# Patient Record
Sex: Female | Born: 1955
Health system: Southern US, Community
[De-identification: ages and names within clinical notes are randomized; demographics above are authoritative.]

## PROBLEM LIST (undated history)

## (undated) DIAGNOSIS — D219 Benign neoplasm of connective and other soft tissue, unspecified: Secondary | ICD-10-CM

## (undated) DIAGNOSIS — Z973 Presence of spectacles and contact lenses: Secondary | ICD-10-CM

## (undated) DIAGNOSIS — N950 Postmenopausal bleeding: Secondary | ICD-10-CM

## (undated) DIAGNOSIS — K219 Gastro-esophageal reflux disease without esophagitis: Secondary | ICD-10-CM

## (undated) DIAGNOSIS — N840 Polyp of corpus uteri: Secondary | ICD-10-CM

## (undated) DIAGNOSIS — M797 Fibromyalgia: Secondary | ICD-10-CM

## (undated) DIAGNOSIS — M1990 Unspecified osteoarthritis, unspecified site: Secondary | ICD-10-CM

## (undated) DIAGNOSIS — E785 Hyperlipidemia, unspecified: Secondary | ICD-10-CM

## (undated) HISTORY — PX: CARDIOVASCULAR STRESS TEST: SHX262

## (undated) HISTORY — DX: Fibromyalgia: M79.7

## (undated) HISTORY — PX: TONSILLECTOMY AND ADENOIDECTOMY: SUR1326

## (undated) HISTORY — DX: Benign neoplasm of connective and other soft tissue, unspecified: D21.9

## (undated) HISTORY — PX: TRANSTHORACIC ECHOCARDIOGRAM: SHX275

---

## 1990-10-01 HISTORY — PX: OTHER SURGICAL HISTORY: SHX169

## 1998-07-12 ENCOUNTER — Other Ambulatory Visit: Admission: RE | Admit: 1998-07-12 | Discharge: 1998-07-12 | Payer: Self-pay | Admitting: Obstetrics and Gynecology

## 1999-07-28 ENCOUNTER — Other Ambulatory Visit: Admission: RE | Admit: 1999-07-28 | Discharge: 1999-07-28 | Payer: Self-pay | Admitting: Obstetrics and Gynecology

## 2000-08-28 ENCOUNTER — Other Ambulatory Visit: Admission: RE | Admit: 2000-08-28 | Discharge: 2000-08-28 | Payer: Self-pay | Admitting: Gynecology

## 2004-01-19 ENCOUNTER — Other Ambulatory Visit: Admission: RE | Admit: 2004-01-19 | Discharge: 2004-01-19 | Payer: Self-pay | Admitting: Gynecology

## 2005-01-25 ENCOUNTER — Other Ambulatory Visit: Admission: RE | Admit: 2005-01-25 | Discharge: 2005-01-25 | Payer: Self-pay | Admitting: Gynecology

## 2006-01-28 ENCOUNTER — Other Ambulatory Visit: Admission: RE | Admit: 2006-01-28 | Discharge: 2006-01-28 | Payer: Self-pay | Admitting: Gynecology

## 2007-01-30 ENCOUNTER — Other Ambulatory Visit: Admission: RE | Admit: 2007-01-30 | Discharge: 2007-01-30 | Payer: Self-pay | Admitting: Gynecology

## 2008-02-10 ENCOUNTER — Other Ambulatory Visit: Admission: RE | Admit: 2008-02-10 | Discharge: 2008-02-10 | Payer: Self-pay | Admitting: Gynecology

## 2008-07-24 ENCOUNTER — Encounter: Admission: RE | Admit: 2008-07-24 | Discharge: 2008-07-24 | Payer: Self-pay | Admitting: Rheumatology

## 2008-11-11 ENCOUNTER — Ambulatory Visit: Payer: Self-pay | Admitting: Gynecology

## 2008-11-18 ENCOUNTER — Ambulatory Visit: Payer: Self-pay | Admitting: Gynecology

## 2009-02-10 ENCOUNTER — Other Ambulatory Visit: Admission: RE | Admit: 2009-02-10 | Discharge: 2009-02-10 | Payer: Self-pay | Admitting: Gynecology

## 2009-02-10 ENCOUNTER — Encounter: Payer: Self-pay | Admitting: Gynecology

## 2009-02-10 ENCOUNTER — Ambulatory Visit: Payer: Self-pay | Admitting: Gynecology

## 2009-11-10 ENCOUNTER — Ambulatory Visit: Payer: Self-pay | Admitting: Gynecology

## 2010-02-20 ENCOUNTER — Other Ambulatory Visit: Admission: RE | Admit: 2010-02-20 | Discharge: 2010-02-20 | Payer: Self-pay | Admitting: Gynecology

## 2010-02-20 ENCOUNTER — Ambulatory Visit: Payer: Self-pay | Admitting: Gynecology

## 2010-03-28 ENCOUNTER — Ambulatory Visit: Payer: Self-pay | Admitting: Gynecology

## 2010-05-23 ENCOUNTER — Ambulatory Visit: Payer: Self-pay | Admitting: Gynecology

## 2010-09-19 ENCOUNTER — Ambulatory Visit: Payer: Self-pay | Admitting: Gynecology

## 2011-02-27 ENCOUNTER — Encounter: Payer: Self-pay | Admitting: Gynecology

## 2011-03-05 ENCOUNTER — Other Ambulatory Visit (HOSPITAL_COMMUNITY)
Admission: RE | Admit: 2011-03-05 | Discharge: 2011-03-05 | Disposition: A | Discharge status: Home / Self Care | Payer: BC Managed Care – PPO | Source: Ambulatory Visit | Attending: Gynecology | Admitting: Gynecology

## 2011-03-06 ENCOUNTER — Encounter (INDEPENDENT_AMBULATORY_CARE_PROVIDER_SITE_OTHER): Payer: BC Managed Care – PPO | Admitting: Gynecology

## 2011-03-06 ENCOUNTER — Other Ambulatory Visit: Payer: Self-pay | Admitting: Gynecology

## 2011-03-06 DIAGNOSIS — Z13220 Encounter for screening for lipoid disorders: Secondary | ICD-10-CM

## 2011-03-06 DIAGNOSIS — Z833 Family history of diabetes mellitus: Secondary | ICD-10-CM

## 2011-03-06 DIAGNOSIS — Z01419 Encounter for gynecological examination (general) (routine) without abnormal findings: Secondary | ICD-10-CM

## 2011-03-06 DIAGNOSIS — Z124 Encounter for screening for malignant neoplasm of cervix: Secondary | ICD-10-CM | POA: Insufficient documentation

## 2011-03-14 ENCOUNTER — Other Ambulatory Visit: Payer: Self-pay | Admitting: Gynecology

## 2011-03-14 DIAGNOSIS — Z1231 Encounter for screening mammogram for malignant neoplasm of breast: Secondary | ICD-10-CM

## 2011-03-20 ENCOUNTER — Ambulatory Visit
Admission: RE | Admit: 2011-03-20 | Discharge: 2011-03-20 | Disposition: A | Discharge status: Home / Self Care | Payer: BC Managed Care – PPO | Source: Ambulatory Visit | Attending: Gynecology | Admitting: Gynecology

## 2011-03-20 DIAGNOSIS — Z1231 Encounter for screening mammogram for malignant neoplasm of breast: Secondary | ICD-10-CM

## 2011-03-27 ENCOUNTER — Other Ambulatory Visit (HOSPITAL_COMMUNITY): Payer: Self-pay | Admitting: Rheumatology

## 2011-03-27 ENCOUNTER — Ambulatory Visit (HOSPITAL_COMMUNITY)
Admission: RE | Admit: 2011-03-27 | Discharge: 2011-03-27 | Disposition: A | Discharge status: Home / Self Care | Payer: BC Managed Care – PPO | Source: Ambulatory Visit | Attending: Rheumatology | Admitting: Rheumatology

## 2011-03-27 DIAGNOSIS — R0602 Shortness of breath: Secondary | ICD-10-CM

## 2011-03-27 DIAGNOSIS — R062 Wheezing: Secondary | ICD-10-CM | POA: Insufficient documentation

## 2011-03-27 DIAGNOSIS — M4120 Other idiopathic scoliosis, site unspecified: Secondary | ICD-10-CM | POA: Insufficient documentation

## 2011-06-06 ENCOUNTER — Ambulatory Visit (INDEPENDENT_AMBULATORY_CARE_PROVIDER_SITE_OTHER): Payer: BC Managed Care – PPO | Admitting: Gynecology

## 2011-06-06 ENCOUNTER — Encounter: Payer: Self-pay | Admitting: Gynecology

## 2011-06-06 ENCOUNTER — Telehealth: Payer: Self-pay | Admitting: *Deleted

## 2011-06-06 DIAGNOSIS — N926 Irregular menstruation, unspecified: Secondary | ICD-10-CM

## 2011-06-06 DIAGNOSIS — Z79890 Hormone replacement therapy (postmenopausal): Secondary | ICD-10-CM

## 2011-06-06 NOTE — Progress Notes (Signed)
Patient presents complaining of irregular bleeding. She is on HRT estradiol 1 mg daily Prometrium 200 mg for 12 days each month. She has been having light withdrawal bleeds but just started to sporadically bleed now over the last 2 weeks. She did have an endometrial biopsy December 2011 which showed fragments of proliferative endometrium with breakdown.  Exam Abdomen soft nontender without masses guarding rebound organomegaly Pelvic external BUS vagina with light menses flow cervix normal bimanual uterus normal size midline mobile nontender adnexa without masses or tenderness  Assessment and plan: Perimenopausal irregular bleeding on HRT. Did have an endometrial sample 9 months ago. Have recommended we go ahead with sonohysterogram now just to rule out endometrial abnormalities well as to resample her.  She does relate that her daughter has what sounds to be Hashimoto's thyroiditis I did order a TSH T4-T3 uptake. I discussed for his options to include the manipulation of her HRT regimen up to and including low-dose oral contraceptives. She asked about hysterectomy I reviewed with her I think this will drastic this point that we should at least try more conservative attempts to control her bleeding before proceeding with that.

## 2011-06-06 NOTE — Telephone Encounter (Signed)
PT CALLING C/O VAGINAL BLEEDING. LMP:05/24/11 SHE STATES ON 05/19/11 HAD BROWNISH DISCHARGE WITH BLOOD FOR ABOUT 6 DAYS. THEN HER PERIOD CAME ON. PT IS CONCERNED WITH ALL THE BLEEDING. SHE HAS TAKEN ALL THE RX AS DIRECTED ESTRADIOL 1MG  AND PROMETRIUM 200MG . PLEASE ADVISE.

## 2011-06-06 NOTE — Telephone Encounter (Signed)
PT INFORMED WITH THE BELOW NOTE, PT TRANSFERRED TO APPOINTMENT DESK.

## 2011-06-06 NOTE — Telephone Encounter (Signed)
Recommend office visit for evaluation

## 2011-06-07 LAB — T3 UPTAKE: T3 Uptake: 27.9 % (ref 22.5–37.0)

## 2011-06-21 ENCOUNTER — Ambulatory Visit (INDEPENDENT_AMBULATORY_CARE_PROVIDER_SITE_OTHER): Admission: RE | Admit: 2011-06-21 | Payer: BC Managed Care – PPO | Source: Ambulatory Visit

## 2011-06-21 ENCOUNTER — Other Ambulatory Visit: Payer: Self-pay | Admitting: Gynecology

## 2011-06-21 ENCOUNTER — Ambulatory Visit (INDEPENDENT_AMBULATORY_CARE_PROVIDER_SITE_OTHER): Payer: BC Managed Care – PPO | Admitting: Gynecology

## 2011-06-21 DIAGNOSIS — N938 Other specified abnormal uterine and vaginal bleeding: Secondary | ICD-10-CM

## 2011-06-21 DIAGNOSIS — D251 Intramural leiomyoma of uterus: Secondary | ICD-10-CM

## 2011-06-21 DIAGNOSIS — N926 Irregular menstruation, unspecified: Secondary | ICD-10-CM

## 2011-06-21 DIAGNOSIS — D259 Leiomyoma of uterus, unspecified: Secondary | ICD-10-CM

## 2011-06-21 DIAGNOSIS — N950 Postmenopausal bleeding: Secondary | ICD-10-CM

## 2011-06-21 DIAGNOSIS — N949 Unspecified condition associated with female genital organs and menstrual cycle: Secondary | ICD-10-CM

## 2011-06-21 DIAGNOSIS — N920 Excessive and frequent menstruation with regular cycle: Secondary | ICD-10-CM

## 2011-06-21 DIAGNOSIS — N831 Corpus luteum cyst of ovary, unspecified side: Secondary | ICD-10-CM

## 2011-06-21 DIAGNOSIS — N8300 Follicular cyst of ovary, unspecified side: Secondary | ICD-10-CM

## 2011-06-21 NOTE — Progress Notes (Signed)
Patient presents with sonohysterogram due to postmenopausal bleeding on HRT. Ultrasound initially showed several small myomas the largest measuring 31 mm followed by 29 and 25. Several physiologic small changes in right / left ovaries which appear normal. Endometrial echo 7.3 mm. Sonohysterogram was performed, sterile technique, easy catheter introduction, good distention with no abnormalities. An endometrial biopsy was taken. Patient tolerated procedure well.  Postmenopausal bleeding, negative sonohysterogram other than several small myomas. She will call for biopsy results, assuming benign will keep menstrual calendar for now since she has regular withdrawal bleeds after her monthly Prometrium will follow if irregular bleeding continues we'll discuss alternative hormone regimens. Patient is asking about hysterectomy and I again discouraged her at this point as I think we have more conservative alternatives.

## 2012-03-04 ENCOUNTER — Other Ambulatory Visit: Payer: Self-pay | Admitting: *Deleted

## 2012-03-04 MED ORDER — ALPRAZOLAM 0.25 MG PO TABS: 0.2500 mg | ORAL_TABLET | Freq: Every evening | ORAL | Status: DC | PRN

## 2012-03-04 NOTE — Telephone Encounter (Signed)
rx called in

## 2012-03-20 ENCOUNTER — Other Ambulatory Visit: Payer: Self-pay | Admitting: Gynecology

## 2012-03-20 MED ORDER — ESTRADIOL 1 MG PO TABS: 1.0000 mg | ORAL_TABLET | Freq: Every day | ORAL | Status: DC

## 2012-03-20 MED ORDER — PROGESTERONE MICRONIZED 200 MG PO CAPS: 200.0000 mg | ORAL_CAPSULE | Freq: Every day | ORAL | Status: DC

## 2012-05-05 ENCOUNTER — Other Ambulatory Visit: Payer: Self-pay | Admitting: *Deleted

## 2012-05-05 MED ORDER — PROGESTERONE MICRONIZED 200 MG PO CAPS: 200.0000 mg | ORAL_CAPSULE | Freq: Every day | ORAL | Status: DC

## 2012-05-05 MED ORDER — ESTRADIOL 1 MG PO TABS: 1.0000 mg | ORAL_TABLET | Freq: Every day | ORAL | Status: DC

## 2012-05-15 ENCOUNTER — Ambulatory Visit (INDEPENDENT_AMBULATORY_CARE_PROVIDER_SITE_OTHER): Payer: BC Managed Care – PPO | Admitting: Gynecology

## 2012-05-15 ENCOUNTER — Encounter: Payer: Self-pay | Admitting: Gynecology

## 2012-05-15 VITALS — BP 130/84 | Ht 64.5 in | Wt 159.5 lb

## 2012-05-15 DIAGNOSIS — Z01419 Encounter for gynecological examination (general) (routine) without abnormal findings: Secondary | ICD-10-CM

## 2012-05-15 DIAGNOSIS — Z79890 Hormone replacement therapy (postmenopausal): Secondary | ICD-10-CM

## 2012-05-15 MED ORDER — PROGESTERONE MICRONIZED 100 MG PO CAPS: 100.0000 mg | ORAL_CAPSULE | Freq: Every day | ORAL | Status: DC

## 2012-05-15 MED ORDER — ESTRADIOL 1 MG PO TABS: 1.0000 mg | ORAL_TABLET | Freq: Every day | ORAL | Status: DC

## 2012-05-15 NOTE — Patient Instructions (Signed)
Switch hormone replacement to continuous with estradiol and progesterone daily. Call if any bleeding or any other issues. Schedule your mammogram now. Follow up in one year for annual gynecologic exam.

## 2012-05-15 NOTE — Progress Notes (Signed)
Katie Shepard Feb 24, 1956 [id number]        56 y.o.  [id number] for annual exam.  Several issues noted below.  Past medical history,surgical history, medications, allergies, family history and social history were all reviewed and documented in the EPIC chart. ROS:  Was performed and pertinent positives and negatives are included in the history.  Exam: Sherrilyn Rist assistant Filed Vitals:   05/15/12 1053  BP: 130/84  Height: 5' 4.5" (1.638 m)  Weight: 159 lb 8 oz (72.349 kg)   General appearance  Normal Skin grossly normal Head/Neck normal with no cervical or supraclavicular adenopathy thyroid normal Lungs  clear Cardiac RR, without RMG Abdominal  soft, nontender, without masses, organomegaly or hernia Breasts  examined lying and sitting without masses, retractions, discharge or axillary adenopathy. Pelvic  Ext/BUS/vagina  normal   Cervix  normal   Uterus  anteverted, normal size, shape and contour, midline and mobile nontender   Adnexa  Without masses or tenderness    Anus and perineum  normal   Rectovaginal  normal sphincter tone without palpated masses or tenderness.    Assessment/Plan:  56 y.o. [id number] female for annual exam.   1. Postmenopausal/HRT. Patient is on estradiol 1 mg daily and Prometrium 200 mg for the first 12 days each month. She has a very light withdrawal bleed but sometimes skips a month in bleeding.  Having good response as far as hot flashes and sweats. Reviewed again ACOG and NAMS statements for lowest dose for shortest period of time in the [facility] study with increased risks of stroke heart attack DVT and breast cancer. Options to wean now versus continuing reviewed. Patient wants to continue. Options to go on daily dose of estradiol 1 mg/Prometrium 100 mg nightly versus continuing with the intermittent withdrawal discussed and she wants to try the daily dose to avoid staining. Estradiol 1 mg and Prometrium 100 mg daily prescribed x1 year. Follow up if any  issues. 2. Mammography. Patient had her mammogram last year I recommended repeat this year. SBE monthly reviewed. 3. Pap smear. Pap 2012 normal. No Pap done today. Review current screening guidelines we'll plan every 3-5 your screening. 4. DEXA. DEXA June 2011 normal. Plan repeat at five-year interval. Increase calcium vitamin D reviewed. 5. Colonoscopy. Patient had colonoscopy this past year with polyp found. Recommend repeat in 3 years. 6. Health maintenance. Recently started on antihypertensive. Having some swelling in her feet and I recommended she follow up with her primary in reference to this. No blood work was done today as it is all done through her primary physician's office. Follow up in one year, sooner as needed.    Dara Lords MD, 11:29 AM 05/15/2012

## 2012-05-16 LAB — URINALYSIS W MICROSCOPIC + REFLEX CULTURE
Glucose, UA: NEGATIVE mg/dL
Hgb urine dipstick: NEGATIVE
Ketones, ur: NEGATIVE mg/dL
Leukocytes, UA: NEGATIVE
Nitrite: NEGATIVE
Specific Gravity, Urine: 1.01 (ref 1.005–1.030)
Urobilinogen, UA: 0.2 mg/dL (ref 0.0–1.0)

## 2013-01-23 ENCOUNTER — Other Ambulatory Visit: Payer: Self-pay

## 2013-01-25 MED ORDER — ALPRAZOLAM 0.25 MG PO TABS: 0.2500 mg | ORAL_TABLET | Freq: Every evening | ORAL | Status: DC | PRN

## 2013-01-26 NOTE — Telephone Encounter (Signed)
Called into pharmacy

## 2013-06-09 ENCOUNTER — Other Ambulatory Visit: Payer: Self-pay | Admitting: *Deleted

## 2013-06-09 MED ORDER — ESTRADIOL 1 MG PO TABS: 1.0000 mg | ORAL_TABLET | Freq: Every day | ORAL | Status: DC

## 2013-06-09 MED ORDER — PROGESTERONE MICRONIZED 100 MG PO CAPS: 100.0000 mg | ORAL_CAPSULE | Freq: Every day | ORAL | Status: DC

## 2013-07-16 ENCOUNTER — Encounter: Payer: Self-pay | Admitting: Gynecology

## 2013-07-16 ENCOUNTER — Ambulatory Visit (INDEPENDENT_AMBULATORY_CARE_PROVIDER_SITE_OTHER): Payer: BC Managed Care – PPO | Admitting: Gynecology

## 2013-07-16 VITALS — BP 122/84 | Ht 65.0 in | Wt 169.0 lb

## 2013-07-16 DIAGNOSIS — N950 Postmenopausal bleeding: Secondary | ICD-10-CM

## 2013-07-16 DIAGNOSIS — Z79890 Hormone replacement therapy (postmenopausal): Secondary | ICD-10-CM

## 2013-07-16 DIAGNOSIS — Z01419 Encounter for gynecological examination (general) (routine) without abnormal findings: Secondary | ICD-10-CM

## 2013-07-16 NOTE — Patient Instructions (Addendum)
Follow up for ultrasound as scheduled.  Call to Schedule your mammogram  Facilities in [place]: 1)  [facility], [address] Phone: [phone] 2)  [facility], [address] Phone: [phone] 3)  Dr. Bertrand at [facility]  [address] Phone: [phone]     Mammogram A mammogram is an X-ray test to find changes in a woman's breast. You should get a mammogram if:  You are 40 years of age or older  You have risk factors.   Your doctor recommends that you have one.  BEFORE THE TEST  Do not schedule the test the week before your period, especially if your breasts are sore during this time.  On the day of your mammogram:  Wash your breasts and armpits well. After washing, do not put on any deodorant or talcum powder on until after your test.   Eat and drink as you usually do.   Take your medicines as usual.   If you are diabetic and take insulin, make sure you:   Eat before coming for your test.   Take your insulin as usual.   If you cannot keep your appointment, call before the appointment to cancel. Schedule another appointment.  TEST  You will need to undress from the waist up. You will put on a hospital gown.   Your breast will be put on the mammogram machine, and it will press firmly on your breast with a piece of plastic called a compression paddle. This will make your breast flatter so that the machine can X-ray all parts of your breast.   Both breasts will be X-rayed. Each breast will be X-rayed from above and from the side. An X-ray might need to be taken again if the picture is not good enough.   The mammogram will last about 15 to 30 minutes.  AFTER THE TEST Finding out the results of your test Ask when your test results will be ready. Make sure you get your test results.  Document Released: 12/14/2008 Document Revised: 09/06/2011 Document  Reviewed: 12/14/2008 [employer] Patient Information 2012 [employer].   

## 2013-07-16 NOTE — Progress Notes (Signed)
BREANA LITTS 1956/02/17 [id number]        57 y.o.  [id number] for annual exam.  Several issues that are below.  Past medical history,surgical history, medications, allergies, family history and social history were all reviewed and documented in the EPIC chart.  ROS:  Performed and pertinent positives and negatives are included in the history, assessment and plan .  Exam: Kim assistant Filed Vitals:   07/16/13 1215  BP: 122/84  Height: 5\' 5"  (1.651 m)  Weight: 169 lb (76.658 kg)   General appearance  Normal Skin grossly normal Head/Neck normal with no cervical or supraclavicular adenopathy thyroid normal Lungs  clear Cardiac RR, without RMG Abdominal  soft, nontender, without masses, organomegaly or hernia Breasts  examined lying and sitting without masses, retractions, discharge or axillary adenopathy. Pelvic  Ext/BUS/vagina  normal  Cervix  normal with mucoid staining at the cervical os  Uterus  anteverted, normal size, shape and contour, midline and mobile nontender   Adnexa  Without masses or tenderness    Anus and perineum  normal   Rectovaginal  normal sphincter tone without palpated masses or tenderness.    Assessment/Plan:  57 y.o. [id number] female for annual exam.   1. Postmenopausal/HRT/breakthrough bleeding. Patient on estradiol 1 mg and Prometrium 100 mg nightly. Has had some staining on and off for the past several months. History of sonohysterogram with endometrial biopsy 2 years ago. Several small myomas noted otherwise negative. We'll go ahead and relooked with sonohysterogram now and then discuss changing her hormone regimen as she does when continue on this.  We've discussed the risks of HRT to include the [facility] study with risks of stroke heart attack DVT and breast cancer as in the past 05/15/2012 at. We'll further discuss regiment after sonohysterogram. 2. Mammography 2012. Patient knows she is overdue and agrees to schedule. SBE monthly reviewed. 3. Pap smear 2012.  Pap smear done today. No history of significant abnormal Pap smears. Plan repeat Pap smear next year 3 year interval. 4. DEXA 2011 normal. Plan repeat closer to 62. Increase calcium vitamin D reviewed. 5. Colonoscopy 2012. Repeated their recommended interval. 6. Health maintenance. Sees Dr. Evlyn Kanner who does her routine blood work. Followup for sonohysterogram  Note: This document was prepared with digital dictation and possible smart phrase technology. Any transcriptional errors that result from this process are unintentional.   Dara Lords MD, 12:40 PM 07/16/2013

## 2013-08-07 ENCOUNTER — Ambulatory Visit (INDEPENDENT_AMBULATORY_CARE_PROVIDER_SITE_OTHER): Payer: BC Managed Care – PPO | Admitting: Gynecology

## 2013-08-07 ENCOUNTER — Ambulatory Visit (INDEPENDENT_AMBULATORY_CARE_PROVIDER_SITE_OTHER): Payer: BC Managed Care – PPO

## 2013-08-07 ENCOUNTER — Other Ambulatory Visit: Payer: Self-pay | Admitting: Gynecology

## 2013-08-07 ENCOUNTER — Encounter: Payer: Self-pay | Admitting: Gynecology

## 2013-08-07 DIAGNOSIS — D251 Intramural leiomyoma of uterus: Secondary | ICD-10-CM

## 2013-08-07 DIAGNOSIS — N950 Postmenopausal bleeding: Secondary | ICD-10-CM

## 2013-08-07 NOTE — Patient Instructions (Signed)
Office will call you with biopsy results.  Call if bleeding continues. 

## 2013-08-07 NOTE — Progress Notes (Signed)
Patient presents for sonohysterogram due to post menopausal bleeding on HRT. She's been spotting intermittently on and off.  Ultrasound shows uterus grossly normal/generous in size with multiple small myomas the largest measuring 32 mm. Endometrial echo 6.8 mm. Right and left ovaries are normal noting left was difficult to visualize but no pathologic seen. Cul-de-sac is negative.  Sonohysterogram performed, sterile technique, easy catheter introduction, good distention with no abnormalities. Endometrial sample taken. Patient tolerated well.  Assessment and plan: Postmenopausal spotting on HRT. Will followup for biopsy. Assuming negative will follow bleeding at present. If it continues then we'll readjust her HRT. If it resolves then we will follow.

## 2013-08-14 ENCOUNTER — Other Ambulatory Visit: Payer: Self-pay | Admitting: Gynecology

## 2013-08-14 NOTE — Telephone Encounter (Signed)
You saw patient on 08/07/13 for Katie Shepard and wrote: "Assessment and plan: Postmenopausal spotting on HRT. Will followup for biopsy. Assuming negative will follow bleeding at present. If it continues then we'll readjust her HRT. If it resolves then we will follow."  She does not currently have appt scheduled.

## 2013-11-16 ENCOUNTER — Ambulatory Visit (INDEPENDENT_AMBULATORY_CARE_PROVIDER_SITE_OTHER): Payer: BC Managed Care – PPO | Admitting: Gynecology

## 2013-11-16 ENCOUNTER — Encounter: Payer: Self-pay | Admitting: Gynecology

## 2013-11-16 DIAGNOSIS — N949 Unspecified condition associated with female genital organs and menstrual cycle: Secondary | ICD-10-CM

## 2013-11-16 DIAGNOSIS — N9089 Other specified noninflammatory disorders of vulva and perineum: Secondary | ICD-10-CM

## 2013-11-16 DIAGNOSIS — N898 Other specified noninflammatory disorders of vagina: Secondary | ICD-10-CM

## 2013-11-16 DIAGNOSIS — R32 Unspecified urinary incontinence: Secondary | ICD-10-CM

## 2013-11-16 DIAGNOSIS — R102 Pelvic and perineal pain: Secondary | ICD-10-CM

## 2013-11-16 LAB — URINALYSIS W MICROSCOPIC + REFLEX CULTURE
BILIRUBIN URINE: NEGATIVE
Glucose, UA: NEGATIVE mg/dL
Hgb urine dipstick: NEGATIVE
KETONES UR: NEGATIVE mg/dL
Leukocytes, UA: NEGATIVE
Nitrite: NEGATIVE
PROTEIN: NEGATIVE mg/dL
Specific Gravity, Urine: 1.015 (ref 1.005–1.030)
UROBILINOGEN UA: 0.2 mg/dL (ref 0.0–1.0)
pH: 5.5 (ref 5.0–8.0)

## 2013-11-16 LAB — WET PREP FOR TRICH, YEAST, CLUE
Trich, Wet Prep: NONE SEEN
Yeast Wet Prep HPF POC: NONE SEEN

## 2013-11-16 MED ORDER — METRONIDAZOLE 500 MG PO TABS: 500.0000 mg | ORAL_TABLET | Freq: Two times a day (BID) | ORAL | Status: DC

## 2013-11-16 NOTE — Patient Instructions (Signed)
Take Flagyl medication twice daily for 7 days. Avoid alcohol while taking. Call if her pelvic discomfort continues and we will schedule a GYN ultrasound. Office will contact you to arrange urology appointment.

## 2013-11-16 NOTE — Progress Notes (Signed)
Katie Shepard [birth year] [id number]        58 y.o.  [id number] patient presents with a number of complaints to include: 1. Urinary incontinence with both stress and urgency symptoms. Been going on for several years but seems to gradually be getting worse. Has tried Kegel exercises without much benefit. 2. Vulvar bump over the last several days which now is draining, using sitz baths. 3. Vaginal discharge over the last week or so with some irritation and itching. No odor. 4. Suprapubic discomfort aching of the last [redacted] weeks along with intermittent right lower quadrant discomfort. No nausea vomiting diarrhea constipation. No acute changes in her urinary symptoms. No fever chills low back pain.  Past medical history,surgical history, problem list, medications, allergies, family history and social history were all reviewed and documented in the EPIC chart.  Exam: Kim assistant General appearance  Normal  Spine straight without CVA tenderness. Abdomen soft nontender without masses guarding rebound organomegaly. Pelvic external BUS vagina small pustule lower right labia minora draining.  Slight white discharge with perineal erythema. No specific lesions. Cervix grossly normal. Uterus grossly normal size midline mobile nontender. Adnexa without masses or tenderness  Assessment/Plan:  58 y.o. [id number]  with: 1. Urinary incontinence, mixed pattern with both stress and urgency symptoms. Urinalysis is negative. Discussed possible interstitial cystitis. Options for medication trial such as OAB medication versus urology referral discussed. Patient agrees with urology referral and we'll go ahead and facilitate this for her. 2. Small vulvar pustule draining. Continue with sitz baths until results. Followup if it persists or worsens. 3. Suprapubic discomfort with some right lower quadrant pain. Exam is normal. Urinalysis is negative. Since relatively short history will observe at present. Continue she'll call me  and we'll plan GYN ultrasound rule out nonpalpable abnormalities. 4. Vaginal discharge. Wet prep consistent with early bacterial vaginosis. Will treat with Flagyl 500 mg twice a day x7 days, alcohol avoidance reviewed. Followup if persists, worsens or recurs.   Note: This document was prepared with digital dictation and possible smart phrase technology. Any transcriptional errors that result from this process are unintentional.   Anastasio Auerbach MD, 11:33 AM 11/16/2013

## 2013-11-19 ENCOUNTER — Telehealth: Payer: Self-pay | Admitting: *Deleted

## 2013-11-19 NOTE — Telephone Encounter (Signed)
Message copied by Thamas Jaegers on Thu Nov 19, 2013  9:33 AM ------      Message from: Anastasio Auerbach      Created: Mon Nov 16, 2013 11:31 AM       Schedule appointment with urology reference to urinary incontinence. ------

## 2013-11-19 NOTE — Telephone Encounter (Signed)
Appt. On 12/01/13 @ 1:00 pm with Dr.Ottelin, notes faxed. Left message for pt to call.

## 2013-11-23 ENCOUNTER — Other Ambulatory Visit: Payer: Self-pay

## 2013-11-23 MED ORDER — ALPRAZOLAM 0.25 MG PO TABS: 0.2500 mg | ORAL_TABLET | Freq: Every evening | ORAL | Status: DC | PRN

## 2013-11-23 NOTE — Telephone Encounter (Signed)
Called into pharmacy

## 2013-11-24 NOTE — Telephone Encounter (Signed)
Pt informed with the below note. 

## 2014-08-02 ENCOUNTER — Encounter: Payer: Self-pay | Admitting: Gynecology

## 2014-08-10 ENCOUNTER — Telehealth: Payer: Self-pay | Admitting: *Deleted

## 2014-08-10 MED ORDER — ALPRAZOLAM 0.25 MG PO TABS: 0.2500 mg | ORAL_TABLET | Freq: Every evening | ORAL | Status: DC | PRN

## 2014-08-10 NOTE — Telephone Encounter (Signed)
Pt requesting refill on xanax 0.25 mg tablet last filled on 11/23/13. Okay to fill?

## 2014-08-10 NOTE — Telephone Encounter (Signed)
Rx called in 

## 2014-08-10 NOTE — Telephone Encounter (Signed)
Okay for Xanax 0.25 mg #30 refill 2

## 2014-09-08 ENCOUNTER — Other Ambulatory Visit: Payer: Self-pay | Admitting: Gynecology

## 2014-10-11 ENCOUNTER — Other Ambulatory Visit: Payer: Self-pay | Admitting: Gynecology

## 2014-11-22 ENCOUNTER — Encounter: Payer: Self-pay | Admitting: Gynecology

## 2014-11-22 ENCOUNTER — Other Ambulatory Visit (HOSPITAL_COMMUNITY)
Admission: RE | Admit: 2014-11-22 | Discharge: 2014-11-22 | Disposition: A | Discharge status: Home / Self Care | Payer: BLUE CROSS/BLUE SHIELD | Source: Ambulatory Visit | Attending: Gynecology | Admitting: Gynecology

## 2014-11-22 ENCOUNTER — Ambulatory Visit (INDEPENDENT_AMBULATORY_CARE_PROVIDER_SITE_OTHER): Payer: BLUE CROSS/BLUE SHIELD | Admitting: Gynecology

## 2014-11-22 VITALS — BP 120/80 | Ht 65.0 in | Wt 185.0 lb

## 2014-11-22 DIAGNOSIS — Z79890 Hormone replacement therapy (postmenopausal): Secondary | ICD-10-CM

## 2014-11-22 DIAGNOSIS — D251 Intramural leiomyoma of uterus: Secondary | ICD-10-CM

## 2014-11-22 DIAGNOSIS — Z01419 Encounter for gynecological examination (general) (routine) without abnormal findings: Secondary | ICD-10-CM

## 2014-11-22 DIAGNOSIS — Z1151 Encounter for screening for human papillomavirus (HPV): Secondary | ICD-10-CM | POA: Insufficient documentation

## 2014-11-22 DIAGNOSIS — N950 Postmenopausal bleeding: Secondary | ICD-10-CM

## 2014-11-22 NOTE — Progress Notes (Signed)
Katie Shepard May 13, 1956 [id number]        59 y.o.  [id number] for annual exam.  Several issues noted below.  Past medical history,surgical history, problem list, medications, allergies, family history and social history were all reviewed and documented as reviewed in the EPIC chart.  ROS:  Performed with pertinent positives and negatives included in the history, assessment and plan.   Additional significant findings :  none   Exam: Kim Counsellor Vitals:   11/22/14 1151  BP: 120/80  Height: 5\' 5"  (1.651 m)  Weight: 185 lb (83.915 kg)   General appearance:  Normal affect, orientation and appearance. Skin: Grossly normal HEENT: Without gross lesions.  No cervical or supraclavicular adenopathy. Thyroid normal.  Lungs:  Clear without wheezing, rales or rhonchi Cardiac: RR, without RMG Abdominal:  Soft, nontender, without masses, guarding, rebound, organomegaly or hernia Breasts:  Examined lying and sitting without masses, retractions, discharge or axillary adenopathy. Pelvic:  Ext/BUS/vagina with mild trophic changes  Cervix grossly normal with mucoid bloody discharge. Pap/HPV  Uterus anteverted, normal size, shape and contour, midline and mobile nontender   Adnexa  Without masses or tenderness    Anus and perineum  Normal   Rectovaginal  Normal sphincter tone without palpated masses or tenderness.    Assessment/Plan:  59 y.o. [id number] female for annual exam.   1. Postmenopausal/HRT. Patient on Estrace 1 mg and Prometrium 100 mg nightly. Has almost daily mucoid bloody staining. Was evaluated with sonohysterogram 08/2013 which was negative for intracavitary abnormalities and a negative endometrial biopsy. Did show several small myomas. Recommend repeat sonohysterogram now on patient agrees to schedule. I again reviewed HRT with her and the options to wean versus continuing. Risks reviewed to include increased risk of stroke heart attack DVT and breast cancer. If patient desires to  continue then I reviewed changing her regimen to hopefully illuminate the staining assuming her sonohysterogram and biopsy are negative. We'll further discuss at her sonohysterogram.  I did not refill her HRT at this point and will readdress at the sonohysterogram 2. Mammography 2012. Strongly recommended patient schedule her screening mammogram as she agrees to do so. Benefits of early detection reviewed. SBE monthly reviewed. 3. Pap smear 2012. Pap smear/HPV done today. No history of significant abnormal Pap smears previously. 4. DEXA 2011 normal. Plan repeat DEXA at age 28. Increased calcium vitamin D reviewed. 5. Colonoscopy 2013. Repeat at their recommended interval. 6. Audible wheezing with cough. Patient notes that whenever she is exerting herself. Lungs are clear and throat/pharynx is normal. Recommend ENT for vocal cord exam and overall assessment.  Patient will call and schedule him enough she has problems arranging this. 7. Health maintenance. No routine blood work done as she reports this done at her primary physician's office. Follow up for sonohysterogram.     Anastasio Auerbach MD, 12:14 PM 11/22/2014

## 2014-11-22 NOTE — Addendum Note (Signed)
Addended by: Nelva Nay on: 11/22/2014 12:21 PM   Modules accepted: Orders

## 2014-11-22 NOTE — Patient Instructions (Signed)
Follow up with the ENT doctor as we discussed. Call if you have any issues arranging this. Schedule your mammography. Follow up for the GYN ultrasound as scheduled.  You may obtain a copy of any labs that were done today by logging onto MyChart as outlined in the instructions provided with your AVS (after visit summary). The office will not call with normal lab results but certainly if there are any significant abnormalities then we will contact you.   Health Maintenance, Female A healthy lifestyle and preventative care can promote health and wellness.  Maintain regular health, dental, and eye exams.  Eat a healthy diet. Foods like vegetables, fruits, whole grains, low-fat dairy products, and lean protein foods contain the nutrients you need without too many calories. Decrease your intake of foods high in solid fats, added sugars, and salt. Get information about a proper diet from your caregiver, if necessary.  Regular physical exercise is one of the most important things you can do for your health. Most adults should get at least 150 minutes of moderate-intensity exercise (any activity that increases your heart rate and causes you to sweat) each week. In addition, most adults need muscle-strengthening exercises on 2 or more days a week.   Maintain a healthy weight. The body mass index (BMI) is a screening tool to identify possible weight problems. It provides an estimate of body fat based on height and weight. Your caregiver can help determine your BMI, and can help you achieve or maintain a healthy weight. For adults 20 years and older:  A BMI below 18.5 is considered underweight.  A BMI of 18.5 to 24.9 is normal.  A BMI of 25 to 29.9 is considered overweight.  A BMI of 30 and above is considered obese.  Maintain normal blood lipids and cholesterol by exercising and minimizing your intake of saturated fat. Eat a balanced diet with plenty of fruits and vegetables. Blood tests for lipids  and cholesterol should begin at age 72 and be repeated every 5 years. If your lipid or cholesterol levels are high, you are over 50, or you are a high risk for heart disease, you may need your cholesterol levels checked more frequently.Ongoing high lipid and cholesterol levels should be treated with medicines if diet and exercise are not effective.  If you smoke, find out from your caregiver how to quit. If you do not use tobacco, do not start.  Lung cancer screening is recommended for adults aged 26 80 years who are at high risk for developing lung cancer because of a history of smoking. Yearly low-dose computed tomography (CT) is recommended for people who have at least a 30-pack-year history of smoking and are a current smoker or have quit within the past 15 years. A pack year of smoking is smoking an average of 1 pack of cigarettes a day for 1 year (for example: 1 pack a day for 30 years or 2 packs a day for 15 years). Yearly screening should continue until the smoker has stopped smoking for at least 15 years. Yearly screening should also be stopped for people who develop a health problem that would prevent them from having lung cancer treatment.  If you are pregnant, do not drink alcohol. If you are breastfeeding, be very cautious about drinking alcohol. If you are not pregnant and choose to drink alcohol, do not exceed 1 drink per day. One drink is considered to be 12 ounces (355 mL) of beer, 5 ounces (148 mL) of wine, or  1.5 ounces (44 mL) of liquor.  Avoid use of street drugs. Do not share needles with anyone. Ask for help if you need support or instructions about stopping the use of drugs.  High blood pressure causes heart disease and increases the risk of stroke. Blood pressure should be checked at least every 1 to 2 years. Ongoing high blood pressure should be treated with medicines, if weight loss and exercise are not effective.  If you are 85 to 57 years old, ask your caregiver if you  should take aspirin to prevent strokes.  Diabetes screening involves taking a blood sample to check your fasting blood sugar level. This should be done once every 3 years, after age 41, if you are within normal weight and without risk factors for diabetes. Testing should be considered at a younger age or be carried out more frequently if you are overweight and have at least 1 risk factor for diabetes.  Breast cancer screening is essential preventative care for women. You should practice "breast self-awareness." This means understanding the normal appearance and feel of your breasts and may include breast self-examination. Any changes detected, no matter how small, should be reported to a caregiver. Women in their 98s and 30s should have a clinical breast exam (CBE) by a caregiver as part of a regular health exam every 1 to 3 years. After age 25, women should have a CBE every year. Starting at age 32, women should consider having a mammogram (breast X-ray) every year. Women who have a family history of breast cancer should talk to their caregiver about genetic screening. Women at a high risk of breast cancer should talk to their caregiver about having an MRI and a mammogram every year.  Breast cancer gene (BRCA)-related cancer risk assessment is recommended for women who have family members with BRCA-related cancers. BRCA-related cancers include breast, ovarian, tubal, and peritoneal cancers. Having family members with these cancers may be associated with an increased risk for harmful changes (mutations) in the breast cancer genes BRCA1 and BRCA2. Results of the assessment will determine the need for genetic counseling and BRCA1 and BRCA2 testing.  The Pap test is a screening test for cervical cancer. Women should have a Pap test starting at age 48. Between ages 7 and 69, Pap tests should be repeated every 2 years. Beginning at age 60, you should have a Pap test every 3 years as long as the past 3 Pap tests  have been normal. If you had a hysterectomy for a problem that was not cancer or a condition that could lead to cancer, then you no longer need Pap tests. If you are between ages 57 and 51, and you have had normal Pap tests going back 10 years, you no longer need Pap tests. If you have had past treatment for cervical cancer or a condition that could lead to cancer, you need Pap tests and screening for cancer for at least 20 years after your treatment. If Pap tests have been discontinued, risk factors (such as a new sexual partner) need to be reassessed to determine if screening should be resumed. Some women have medical problems that increase the chance of getting cervical cancer. In these cases, your caregiver may recommend more frequent screening and Pap tests.  The human papillomavirus (HPV) test is an additional test that may be used for cervical cancer screening. The HPV test looks for the virus that can cause the cell changes on the cervix. The cells collected during the Pap  test can be tested for HPV. The HPV test could be used to screen women aged 59 years and older, and should be used in women of any age who have unclear Pap test results. After the age of 35, women should have HPV testing at the same frequency as a Pap test.  Colorectal cancer can be detected and often prevented. Most routine colorectal cancer screening begins at the age of 75 and continues through age 75. However, your caregiver may recommend screening at an earlier age if you have risk factors for colon cancer. On a yearly basis, your caregiver may provide home test kits to check for hidden blood in the stool. Use of a small camera at the end of a tube, to directly examine the colon (sigmoidoscopy or colonoscopy), can detect the earliest forms of colorectal cancer. Talk to your caregiver about this at age 74, when routine screening begins. Direct examination of the colon should be repeated every 5 to 10 years through age 44, unless  early forms of pre-cancerous polyps or small growths are found.  Hepatitis C blood testing is recommended for all people born from [birth year] through 1963 and any individual with known risks for hepatitis C.  Practice safe sex. Use condoms and avoid high-risk sexual practices to reduce the spread of sexually transmitted infections (STIs). Sexually active women aged 55 and younger should be checked for Chlamydia, which is a common sexually transmitted infection. Older women with new or multiple partners should also be tested for Chlamydia. Testing for other STIs is recommended if you are sexually active and at increased risk.  Osteoporosis is a disease in which the bones lose minerals and strength with aging. This can result in serious bone fractures. The risk of osteoporosis can be identified using a bone density scan. Women ages 43 and over and women at risk for fractures or osteoporosis should discuss screening with their caregivers. Ask your caregiver whether you should be taking a calcium supplement or vitamin D to reduce the rate of osteoporosis.  Menopause can be associated with physical symptoms and risks. Hormone replacement therapy is available to decrease symptoms and risks. You should talk to your caregiver about whether hormone replacement therapy is right for you.  Use sunscreen. Apply sunscreen liberally and repeatedly throughout the day. You should seek shade when your shadow is shorter than you. Protect yourself by wearing long sleeves, pants, a wide-brimmed hat, and sunglasses year round, whenever you are outdoors.  Notify your caregiver of new moles or changes in moles, especially if there is a change in shape or color. Also notify your caregiver if a mole is larger than the size of a pencil eraser.  Stay current with your immunizations. Document Released: 04/02/2011 Document Revised: 01/12/2013 Document Reviewed: 04/02/2011 [facility] Patient Information 2014 [place].

## 2014-11-23 ENCOUNTER — Other Ambulatory Visit: Payer: Self-pay | Admitting: Gynecology

## 2014-11-23 DIAGNOSIS — N950 Postmenopausal bleeding: Secondary | ICD-10-CM

## 2014-11-23 LAB — URINALYSIS W MICROSCOPIC + REFLEX CULTURE
BACTERIA UA: NONE SEEN
Bilirubin Urine: NEGATIVE
CASTS: NONE SEEN
Crystals: NONE SEEN
GLUCOSE, UA: NEGATIVE mg/dL
HGB URINE DIPSTICK: NEGATIVE
KETONES UR: NEGATIVE mg/dL
LEUKOCYTES UA: NEGATIVE
Nitrite: NEGATIVE
PH: 5.5 (ref 5.0–8.0)
Protein, ur: NEGATIVE mg/dL
Specific Gravity, Urine: 1.015 (ref 1.005–1.030)
Squamous Epithelial / LPF: NONE SEEN
Urobilinogen, UA: 0.2 mg/dL (ref 0.0–1.0)

## 2014-11-24 LAB — CYTOLOGY - PAP

## 2014-12-08 ENCOUNTER — Ambulatory Visit (INDEPENDENT_AMBULATORY_CARE_PROVIDER_SITE_OTHER): Payer: BLUE CROSS/BLUE SHIELD

## 2014-12-08 ENCOUNTER — Other Ambulatory Visit: Payer: Self-pay | Admitting: Gynecology

## 2014-12-08 ENCOUNTER — Ambulatory Visit (INDEPENDENT_AMBULATORY_CARE_PROVIDER_SITE_OTHER): Payer: BLUE CROSS/BLUE SHIELD | Admitting: Gynecology

## 2014-12-08 ENCOUNTER — Encounter: Payer: Self-pay | Admitting: Gynecology

## 2014-12-08 VITALS — BP 114/80

## 2014-12-08 DIAGNOSIS — D251 Intramural leiomyoma of uterus: Secondary | ICD-10-CM | POA: Diagnosis not present

## 2014-12-08 DIAGNOSIS — N950 Postmenopausal bleeding: Secondary | ICD-10-CM | POA: Diagnosis not present

## 2014-12-08 DIAGNOSIS — Z79890 Hormone replacement therapy (postmenopausal): Secondary | ICD-10-CM

## 2014-12-08 NOTE — Patient Instructions (Signed)
Office will call you with biopsy results. Call if your spotting continues

## 2014-12-08 NOTE — Progress Notes (Signed)
Katie Shepard 01/29/1956 [id number]        59 y.o.  [id number] presents percent histogram due to spotting on HRT. Patient is taking Estrace 1 mg daily and Prometrium 100 mg at nighttime.  Past medical history,surgical history, problem list, medications, allergies, family history and social history were all reviewed and documented in the EPIC chart.  Directed ROS with pertinent positives and negatives documented in the history of present illness/assessment and plan.  Exam: [place] assistant Filed Vitals:   12/08/14 1650  BP: 114/80   General appearance:  Normal External BUS vagina normal. Cervix normal. Uterus grossly normal midline mobile nontender. Adnexa without masses or tenderness.  Ultrasound shows uterus normal to generous in size. Multiple small myomas noted measuring 39 mm, 29 mm, 18 mm, 18 mm. Endometrial echo 8.1 mm. Right and left ovaries normal/atrophic in appearance. Cul-de-sac negative.  Sonohysterogram performed, sterile technique, easy catheter introduction, good distention with no abnormalities. Endometrial sample taken. Patient tolerated well.  Assessment/Plan:  59 y.o. [id number] with history as above. Patient will follow up for biopsy results. Assuming negative various options were reviewed to include continued HRT as is versus trying a different regimen such as Activella or increasing her Prometrium to 200 mg nightly. At this point patients comfortable continuing as is and will report if she continues to have bleeding over the next month or so. Assuming her bleeding resolves then will follow expectantly.     Anastasio Auerbach MD, 4:59 PM 12/08/2014

## 2014-12-22 ENCOUNTER — Other Ambulatory Visit: Payer: Self-pay

## 2014-12-22 DIAGNOSIS — Z1231 Encounter for screening mammogram for malignant neoplasm of breast: Secondary | ICD-10-CM

## 2015-01-04 ENCOUNTER — Ambulatory Visit
Admission: RE | Admit: 2015-01-04 | Discharge: 2015-01-04 | Disposition: A | Discharge status: Home / Self Care | Payer: BLUE CROSS/BLUE SHIELD | Source: Ambulatory Visit

## 2015-01-04 DIAGNOSIS — Z1231 Encounter for screening mammogram for malignant neoplasm of breast: Secondary | ICD-10-CM

## 2015-01-11 ENCOUNTER — Other Ambulatory Visit: Payer: Self-pay | Admitting: Gynecology

## 2015-03-05 ENCOUNTER — Other Ambulatory Visit: Payer: Self-pay | Admitting: Gynecology

## 2015-03-07 NOTE — Telephone Encounter (Signed)
rx called in [facility] 

## 2015-12-12 ENCOUNTER — Other Ambulatory Visit: Payer: Self-pay | Admitting: Gynecology

## 2016-01-10 DIAGNOSIS — M542 Cervicalgia: Secondary | ICD-10-CM | POA: Diagnosis not present

## 2016-01-12 DIAGNOSIS — M542 Cervicalgia: Secondary | ICD-10-CM | POA: Diagnosis not present

## 2016-01-13 ENCOUNTER — Other Ambulatory Visit: Payer: Self-pay | Admitting: Gynecology

## 2016-01-16 ENCOUNTER — Other Ambulatory Visit: Payer: Self-pay | Admitting: *Deleted

## 2016-01-16 MED ORDER — PROGESTERONE MICRONIZED 100 MG PO CAPS: ORAL_CAPSULE | ORAL | Status: DC

## 2016-01-16 MED ORDER — ESTRADIOL 1 MG PO TABS: 1.0000 mg | ORAL_TABLET | Freq: Every day | ORAL | Status: DC

## 2016-01-16 NOTE — Telephone Encounter (Signed)
Pt called requesting refill on HRT, annual on 03/07/16.Rx sent.

## 2016-01-17 DIAGNOSIS — M542 Cervicalgia: Secondary | ICD-10-CM | POA: Diagnosis not present

## 2016-01-19 DIAGNOSIS — M542 Cervicalgia: Secondary | ICD-10-CM | POA: Diagnosis not present

## 2016-01-26 DIAGNOSIS — M542 Cervicalgia: Secondary | ICD-10-CM | POA: Diagnosis not present

## 2016-01-31 DIAGNOSIS — M542 Cervicalgia: Secondary | ICD-10-CM | POA: Diagnosis not present

## 2016-02-07 DIAGNOSIS — M542 Cervicalgia: Secondary | ICD-10-CM | POA: Diagnosis not present

## 2016-02-14 DIAGNOSIS — M542 Cervicalgia: Secondary | ICD-10-CM | POA: Diagnosis not present

## 2016-03-07 ENCOUNTER — Encounter: Payer: BLUE CROSS/BLUE SHIELD | Admitting: Gynecology

## 2016-03-12 ENCOUNTER — Encounter: Payer: Self-pay | Admitting: Gynecology

## 2016-03-12 ENCOUNTER — Ambulatory Visit (INDEPENDENT_AMBULATORY_CARE_PROVIDER_SITE_OTHER): Payer: BLUE CROSS/BLUE SHIELD | Admitting: Gynecology

## 2016-03-12 VITALS — BP 120/78 | Ht 66.0 in | Wt 189.0 lb

## 2016-03-12 DIAGNOSIS — Z13220 Encounter for screening for lipoid disorders: Secondary | ICD-10-CM

## 2016-03-12 DIAGNOSIS — Z1321 Encounter for screening for nutritional disorder: Secondary | ICD-10-CM

## 2016-03-12 DIAGNOSIS — Z1329 Encounter for screening for other suspected endocrine disorder: Secondary | ICD-10-CM | POA: Diagnosis not present

## 2016-03-12 DIAGNOSIS — Z01419 Encounter for gynecological examination (general) (routine) without abnormal findings: Secondary | ICD-10-CM

## 2016-03-12 DIAGNOSIS — Z79890 Hormone replacement therapy (postmenopausal): Secondary | ICD-10-CM

## 2016-03-12 LAB — URINALYSIS W MICROSCOPIC + REFLEX CULTURE
BACTERIA UA: NONE SEEN [HPF]
BILIRUBIN URINE: NEGATIVE
CRYSTALS: NONE SEEN [HPF]
Casts: NONE SEEN [LPF]
GLUCOSE, UA: NEGATIVE
Hgb urine dipstick: NEGATIVE
KETONES UR: NEGATIVE
Leukocytes, UA: NEGATIVE
Nitrite: NEGATIVE
Protein, ur: NEGATIVE
RBC / HPF: NONE SEEN RBC/HPF (ref <=2)
Specific Gravity, Urine: 1.025 (ref 1.001–1.035)
WBC, UA: NONE SEEN WBC/HPF (ref <=5)
Yeast: NONE SEEN [HPF]
pH: 6 (ref 5.0–8.0)

## 2016-03-12 MED ORDER — ESTRADIOL 1 MG PO TABS: 1.0000 mg | ORAL_TABLET | Freq: Every day | ORAL | Status: DC

## 2016-03-12 MED ORDER — PROGESTERONE MICRONIZED 100 MG PO CAPS: ORAL_CAPSULE | ORAL | Status: DC

## 2016-03-12 NOTE — Patient Instructions (Signed)

## 2016-03-12 NOTE — Progress Notes (Signed)
    Katie Shepard 15-Jul-1956 [id number]        60 y.o.  [id number]  for annual exam.  Several issues noted below.  Past medical history,surgical history, problem list, medications, allergies, family history and social history were all reviewed and documented as reviewed in the EPIC chart.  ROS:  Performed with pertinent positives and negatives included in the history, assessment and plan.   Additional significant findings :  None   Exam: [name] assistant Filed Vitals:   03/12/16 1614  BP: 120/78  Height: 5\' 6"  (1.676 m)  Weight: 189 lb (85.73 kg)   General appearance:  Normal affect, orientation and appearance. Skin: Grossly normal HEENT: Without gross lesions.  No cervical or supraclavicular adenopathy. Thyroid normal.  Lungs:  Clear without wheezing, rales or rhonchi Cardiac: RR, without RMG Abdominal:  Soft, nontender, without masses, guarding, rebound, organomegaly or hernia Breasts:  Examined lying and sitting without masses, retractions, discharge or axillary adenopathy. Pelvic:  Ext/BUS/vagina With atrophic changes  Cervix with atrophic changes  Uterus anteverted, normal size, shape and contour, midline and mobile nontender   Adnexa without masses or tenderness    Anus and perineum normal   Rectovaginal normal sphincter tone without palpated masses or tenderness.    Assessment/Plan:  60 y.o. [id number] female for annual exam.  1. Postmenopausal/HRT. Patient continues on estradiol 1 mg and Prometrium 100 mg nightly. Was evaluated last year for postmenopausal bleeding with a negative sonohysterogram and biopsy showing atrophic endometrium. Patient notes 2 episodes this past year staining. Reviewed issues and possibilities. As she is roughly within the year being evaluated with negative biopsy and ultrasound will hold on evaluation for now. She knows to call me if she does a further bleeding. I also reviewed the risks of HRT to again include thrombosis such as stroke heart  attack DVT and breast cancer. She understands the risks and wants to continue for now. Refill 1 year provided. 2. Fatigue. Patient notes just feeling tired. Is exercising but having difficulty losing weight. Will check baseline CBC comprehensive metabolic panel and thyroid panel. Encouraging regular exercise and dietary control. 3. Pap smear/HPV 11/2014. No Pap smear done today.  No history of abnormal Pap smears previously. 4. DEXA 2011 normal. Plan repeat DEXA at age 89. Increased calcium vitamin D reviewed. 5. Colonoscopy due last year. She did have polyps and was recommended for a 3 year interval colonoscopy and is late. Patient knows to call and schedule. 6. Mammography 12/2014. I reminded her she is late with this also and she agrees to call and schedule. SBE monthly reviewed. 7. Health maintenance. Patient requests baseline labs. CBC, CMP, lipid profile, TSH, vitamin D, urinalysis ordered. Follow up 1 year, sooner as needed.   Anastasio Auerbach MD, 4:47 PM 03/12/2016

## 2016-03-13 LAB — COMPREHENSIVE METABOLIC PANEL
ALT: 56 U/L — ABNORMAL HIGH (ref 6–29)
AST: 39 U/L — ABNORMAL HIGH (ref 10–35)
Albumin: 4.1 g/dL (ref 3.6–5.1)
Alkaline Phosphatase: 77 U/L (ref 33–130)
BUN: 14 mg/dL (ref 7–25)
CHLORIDE: 101 mmol/L (ref 98–110)
CO2: 22 mmol/L (ref 20–31)
CREATININE: 0.79 mg/dL (ref 0.50–1.05)
Calcium: 9.1 mg/dL (ref 8.6–10.4)
Glucose, Bld: 114 mg/dL — ABNORMAL HIGH (ref 65–99)
POTASSIUM: 4 mmol/L (ref 3.5–5.3)
Sodium: 135 mmol/L (ref 135–146)
Total Bilirubin: 0.4 mg/dL (ref 0.2–1.2)
Total Protein: 7 g/dL (ref 6.1–8.1)

## 2016-03-13 LAB — LIPID PANEL
CHOL/HDL RATIO: 7.4 ratio — AB (ref <=5.0)
CHOLESTEROL: 296 mg/dL — AB (ref 125–200)
HDL: 40 mg/dL — ABNORMAL LOW (ref >=46)
Triglycerides: 488 mg/dL — ABNORMAL HIGH (ref <150)

## 2016-03-13 LAB — CBC WITH DIFFERENTIAL/PLATELET
BASOS ABS: 0 {cells}/uL (ref 0–200)
Basophils Relative: 0 %
EOS PCT: 2 %
Eosinophils Absolute: 156 cells/uL (ref 15–500)
HEMATOCRIT: 42.2 % (ref 35.0–45.0)
Hemoglobin: 14.3 g/dL (ref 11.7–15.5)
LYMPHS ABS: 2340 {cells}/uL (ref 850–3900)
Lymphocytes Relative: 30 %
MCH: 31.3 pg (ref 27.0–33.0)
MCHC: 33.9 g/dL (ref 32.0–36.0)
MCV: 92.3 fL (ref 80.0–100.0)
MPV: 11.1 fL (ref 7.5–12.5)
Monocytes Absolute: 624 cells/uL (ref 200–950)
Monocytes Relative: 8 %
NEUTROS PCT: 60 %
Neutro Abs: 4680 cells/uL (ref 1500–7800)
Platelets: 264 10*3/uL (ref 140–400)
RBC: 4.57 MIL/uL (ref 3.80–5.10)
RDW: 13.1 % (ref 11.0–15.0)
WBC: 7.8 10*3/uL (ref 3.8–10.8)

## 2016-03-13 LAB — THYROID PANEL
Free T4: 0.9 ng/dL (ref 0.8–1.8)
T3 UPTAKE: 29 % (ref 22–35)
T4, Total: 5.7 ug/dL (ref 4.5–12.0)
TSH: 2.69 mIU/L

## 2016-03-13 LAB — VITAMIN D 25 HYDROXY (VIT D DEFICIENCY, FRACTURES): Vit D, 25-Hydroxy: 19 ng/mL — ABNORMAL LOW (ref 30–100)

## 2016-03-15 ENCOUNTER — Other Ambulatory Visit: Payer: Self-pay | Admitting: Gynecology

## 2016-03-15 DIAGNOSIS — E782 Mixed hyperlipidemia: Secondary | ICD-10-CM

## 2016-03-15 DIAGNOSIS — R7309 Other abnormal glucose: Secondary | ICD-10-CM

## 2016-03-15 DIAGNOSIS — E559 Vitamin D deficiency, unspecified: Secondary | ICD-10-CM

## 2016-03-15 MED ORDER — VITAMIN D (ERGOCALCIFEROL) 1.25 MG (50000 UNIT) PO CAPS: 50000.0000 [IU] | ORAL_CAPSULE | ORAL | Status: DC

## 2016-03-27 ENCOUNTER — Other Ambulatory Visit: Payer: BLUE CROSS/BLUE SHIELD

## 2016-03-27 DIAGNOSIS — R7309 Other abnormal glucose: Secondary | ICD-10-CM

## 2016-03-27 DIAGNOSIS — E782 Mixed hyperlipidemia: Secondary | ICD-10-CM

## 2016-03-28 LAB — LIPID PANEL
Cholesterol: 282 mg/dL — ABNORMAL HIGH (ref 125–200)
HDL: 37 mg/dL — ABNORMAL LOW (ref >=46)
Total CHOL/HDL Ratio: 7.6 Ratio — ABNORMAL HIGH (ref <=5.0)
Triglycerides: 466 mg/dL — ABNORMAL HIGH (ref <150)

## 2016-03-28 LAB — COMPREHENSIVE METABOLIC PANEL
ALT: 64 U/L — ABNORMAL HIGH (ref 6–29)
AST: 48 U/L — AB (ref 10–35)
Albumin: 4.1 g/dL (ref 3.6–5.1)
Alkaline Phosphatase: 74 U/L (ref 33–130)
BILIRUBIN TOTAL: 0.5 mg/dL (ref 0.2–1.2)
BUN: 13 mg/dL (ref 7–25)
CHLORIDE: 103 mmol/L (ref 98–110)
CO2: 24 mmol/L (ref 20–31)
CREATININE: 0.78 mg/dL (ref 0.50–1.05)
Calcium: 9.3 mg/dL (ref 8.6–10.4)
GLUCOSE: 91 mg/dL (ref 65–99)
Potassium: 4.5 mmol/L (ref 3.5–5.3)
SODIUM: 137 mmol/L (ref 135–146)
Total Protein: 6.8 g/dL (ref 6.1–8.1)

## 2016-03-30 DIAGNOSIS — J301 Allergic rhinitis due to pollen: Secondary | ICD-10-CM | POA: Diagnosis not present

## 2016-03-30 DIAGNOSIS — Z1389 Encounter for screening for other disorder: Secondary | ICD-10-CM | POA: Diagnosis not present

## 2016-03-30 DIAGNOSIS — M797 Fibromyalgia: Secondary | ICD-10-CM | POA: Diagnosis not present

## 2016-03-30 DIAGNOSIS — I10 Essential (primary) hypertension: Secondary | ICD-10-CM | POA: Diagnosis not present

## 2016-04-24 DIAGNOSIS — G44219 Episodic tension-type headache, not intractable: Secondary | ICD-10-CM | POA: Diagnosis not present

## 2016-04-24 DIAGNOSIS — G43009 Migraine without aura, not intractable, without status migrainosus: Secondary | ICD-10-CM | POA: Diagnosis not present

## 2016-04-24 DIAGNOSIS — F4321 Adjustment disorder with depressed mood: Secondary | ICD-10-CM | POA: Diagnosis not present

## 2016-07-10 ENCOUNTER — Other Ambulatory Visit: Payer: Self-pay | Admitting: Gynecology

## 2016-07-10 NOTE — Telephone Encounter (Signed)
Called into pharmacy

## 2016-07-26 DIAGNOSIS — G43009 Migraine without aura, not intractable, without status migrainosus: Secondary | ICD-10-CM | POA: Diagnosis not present

## 2016-07-26 DIAGNOSIS — F320 Major depressive disorder, single episode, mild: Secondary | ICD-10-CM | POA: Diagnosis not present

## 2016-10-02 DIAGNOSIS — I10 Essential (primary) hypertension: Secondary | ICD-10-CM | POA: Diagnosis not present

## 2016-10-02 DIAGNOSIS — E784 Other hyperlipidemia: Secondary | ICD-10-CM | POA: Diagnosis not present

## 2016-10-02 DIAGNOSIS — Z8639 Personal history of other endocrine, nutritional and metabolic disease: Secondary | ICD-10-CM | POA: Diagnosis not present

## 2016-10-08 DIAGNOSIS — Z1389 Encounter for screening for other disorder: Secondary | ICD-10-CM | POA: Diagnosis not present

## 2016-10-08 DIAGNOSIS — M797 Fibromyalgia: Secondary | ICD-10-CM | POA: Diagnosis not present

## 2016-10-08 DIAGNOSIS — I10 Essential (primary) hypertension: Secondary | ICD-10-CM | POA: Diagnosis not present

## 2016-10-08 DIAGNOSIS — Z0000 Encounter for general adult medical examination without abnormal findings: Secondary | ICD-10-CM | POA: Diagnosis not present

## 2016-10-08 DIAGNOSIS — J309 Allergic rhinitis, unspecified: Secondary | ICD-10-CM | POA: Diagnosis not present

## 2016-10-08 DIAGNOSIS — G43009 Migraine without aura, not intractable, without status migrainosus: Secondary | ICD-10-CM | POA: Diagnosis not present

## 2016-10-11 DIAGNOSIS — Z1212 Encounter for screening for malignant neoplasm of rectum: Secondary | ICD-10-CM | POA: Diagnosis not present

## 2016-10-25 DIAGNOSIS — G44219 Episodic tension-type headache, not intractable: Secondary | ICD-10-CM | POA: Diagnosis not present

## 2016-10-25 DIAGNOSIS — G43009 Migraine without aura, not intractable, without status migrainosus: Secondary | ICD-10-CM | POA: Diagnosis not present

## 2016-10-25 DIAGNOSIS — G4700 Insomnia, unspecified: Secondary | ICD-10-CM | POA: Diagnosis not present

## 2016-10-25 DIAGNOSIS — F4321 Adjustment disorder with depressed mood: Secondary | ICD-10-CM | POA: Diagnosis not present

## 2017-02-19 DIAGNOSIS — G43009 Migraine without aura, not intractable, without status migrainosus: Secondary | ICD-10-CM | POA: Diagnosis not present

## 2017-02-19 DIAGNOSIS — F325 Major depressive disorder, single episode, in full remission: Secondary | ICD-10-CM | POA: Diagnosis not present

## 2017-02-19 DIAGNOSIS — G44219 Episodic tension-type headache, not intractable: Secondary | ICD-10-CM | POA: Diagnosis not present

## 2017-03-13 ENCOUNTER — Encounter: Payer: BLUE CROSS/BLUE SHIELD | Admitting: Gynecology

## 2017-03-22 ENCOUNTER — Ambulatory Visit (INDEPENDENT_AMBULATORY_CARE_PROVIDER_SITE_OTHER): Payer: BLUE CROSS/BLUE SHIELD | Admitting: Gynecology

## 2017-03-22 ENCOUNTER — Encounter: Payer: Self-pay | Admitting: Gynecology

## 2017-03-22 VITALS — BP 122/78 | Ht 64.5 in | Wt 186.0 lb

## 2017-03-22 DIAGNOSIS — N952 Postmenopausal atrophic vaginitis: Secondary | ICD-10-CM | POA: Diagnosis not present

## 2017-03-22 DIAGNOSIS — N926 Irregular menstruation, unspecified: Secondary | ICD-10-CM | POA: Diagnosis not present

## 2017-03-22 DIAGNOSIS — Z01411 Encounter for gynecological examination (general) (routine) with abnormal findings: Secondary | ICD-10-CM | POA: Diagnosis not present

## 2017-03-22 MED ORDER — PROGESTERONE MICRONIZED 100 MG PO CAPS: ORAL_CAPSULE | ORAL | 12 refills | Status: DC

## 2017-03-22 MED ORDER — ESTRADIOL 1 MG PO TABS: 1.0000 mg | ORAL_TABLET | Freq: Every day | ORAL | 12 refills | Status: DC

## 2017-03-22 MED ORDER — FLUCONAZOLE 150 MG PO TABS: 150.0000 mg | ORAL_TABLET | Freq: Once | ORAL | 0 refills | Status: AC

## 2017-03-22 NOTE — Patient Instructions (Signed)
Follow up for ultrasound as scheduled  Schedule your mammogram 

## 2017-03-22 NOTE — Progress Notes (Signed)
    Katie Shepard 21-Jul-1956 [id number]        61 y.o.  [id number] for annual exam.  Continues to have bleeding on and off on HRT. Was evaluated with sonohysterogram 2016 which showed multiple small myomas, empty cavity and atrophic biopsy. There is every several months she'll have a menses like bleeding. Otherwise doing well without hot flushes or night sweats. Also notes some vaginal itching since she started bleeding several days ago. No discharge or odor or urinary symptoms such as frequency dysuria or urgency.  Past medical history,surgical history, problem list, medications, allergies, family history and social history were all reviewed and documented as reviewed in the EPIC chart.  ROS:  Performed with pertinent positives and negatives included in the history, assessment and plan.   Additional significant findings :  None   Exam: [name] assistant Vitals:   03/22/17 1428  BP: 122/78  Weight: 186 lb (84.4 kg)  Height: 5' 4.5" (1.638 m)   Body mass index is 31.43 kg/m.  General appearance:  Normal affect, orientation and appearance. Skin: Grossly normal HEENT: Without gross lesions.  No cervical or supraclavicular adenopathy. Thyroid normal.  Lungs:  Clear without wheezing, rales or rhonchi Cardiac: RR, without RMG Abdominal:  Soft, nontender, without masses, guarding, rebound, organomegaly or hernia Breasts:  Examined lying and sitting without masses, retractions, discharge or axillary adenopathy. Pelvic:  Ext, BUS, Vagina: With atrophic changes  Cervix: With atrophic changes  Uterus: Anteverted, normal size, shape and contour, midline and mobile nontender   Adnexa: Without masses or tenderness    Anus and perineum: Normal   Rectovaginal: Normal sphincter tone without palpated masses or tenderness.    Assessment/Plan:  61 y.o. [id number] female for annual exam.   1. Postmenopausal/HRT/irregular bleeding. Evaluation 2 years ago was negative other than small myomas.  Recommended follow up sonohysterogram now to reevaluate the endometrial cavity is the irregular bleeding seems to have persisted. Patient agrees to schedule. Differential reviewed to and including uterine cancer. Reviewed HRT with her. NAMS 2017 guidelines discussed. Benefits to include symptom relief as well as possible cardiovascular and bone health and started early versus risks of thrombosis such as stroke heart attack DVT and the breast cancer issue discussed. At this point she wants to continue and I refilled her prescription of estradiol 1 mg and Prometrium 100 mg 1 year. I may increase her Prometrium to 200 mg after the sonohysterogram to see if we cannot eliminate the bleeding. 2. Vaginal itching. Started when she started to bleed. I'm going to cover her with Diflucan 150 mg 1 dose. Due to the bleeding a wet prep is not to be possible. Follow up if symptoms persist. 3. Mammography 2016. I again recommended she schedule a screening mammogram. Most common cancer in women reviewed. Patient agrees to call and schedule. SBE monthly reviewed. Breast exam normal today. 4. DEXA 2011 normal. Plan repeat DEXA next year at age 62. 24. Pap smear/HPV 2016. No Pap smear done today. No history of significant abnormal Pap smears. Plan repeat Pap smear at 5 year interval per current screening guidelines. 6. Colonoscopy 2013. Patient apparently overdo and she knows to call and schedule this. 7. Health maintenance. No routine lab work done as patient plans to have done at Dr. Jacquiline Doe office. Follow up for sonohysterogram, follow up in one year for annual exam   Anastasio Auerbach MD, 2:56 PM 03/22/2017

## 2017-04-08 DIAGNOSIS — R0789 Other chest pain: Secondary | ICD-10-CM | POA: Diagnosis not present

## 2017-04-08 DIAGNOSIS — I10 Essential (primary) hypertension: Secondary | ICD-10-CM | POA: Diagnosis not present

## 2017-04-08 DIAGNOSIS — E784 Other hyperlipidemia: Secondary | ICD-10-CM | POA: Diagnosis not present

## 2017-04-08 DIAGNOSIS — M797 Fibromyalgia: Secondary | ICD-10-CM | POA: Diagnosis not present

## 2017-04-12 ENCOUNTER — Telehealth: Payer: Self-pay | Admitting: Cardiovascular Disease

## 2017-04-12 NOTE — Telephone Encounter (Signed)
I spoke with the pt and her dad is a pt of Dr Antionette Char Hartley Barefoot).  I advised her that I can schedule her to see Dr Burt Knack on 04/23/17 at 10:40.  Pt would like her current appointment changed so that she can see Dr Burt Knack.

## 2017-04-12 NOTE — Telephone Encounter (Signed)
Keenan calling, would be a new patient. She states that she was told that she could be worked in to see Dr. Burt Knack as her family as a long history with him.

## 2017-04-17 ENCOUNTER — Ambulatory Visit: Payer: BLUE CROSS/BLUE SHIELD | Admitting: Cardiology

## 2017-04-23 ENCOUNTER — Ambulatory Visit (INDEPENDENT_AMBULATORY_CARE_PROVIDER_SITE_OTHER): Payer: BLUE CROSS/BLUE SHIELD | Admitting: Cardiovascular Disease

## 2017-04-23 ENCOUNTER — Encounter: Payer: Self-pay | Admitting: Cardiovascular Disease

## 2017-04-23 VITALS — BP 126/88 | HR 72 | Ht 64.0 in | Wt 187.8 lb

## 2017-04-23 DIAGNOSIS — R0602 Shortness of breath: Secondary | ICD-10-CM | POA: Diagnosis not present

## 2017-04-23 DIAGNOSIS — R079 Chest pain, unspecified: Secondary | ICD-10-CM | POA: Diagnosis not present

## 2017-04-23 NOTE — Patient Instructions (Signed)
Medication Instructions:  Your physician recommends that you continue on your current medications as directed. Please refer to the Current Medication list given to you today.  Labwork: No new orders.   Testing/Procedures: Your physician has requested that you have an exercise stress myoview. For further information please visit [email or web address]. Please follow instruction sheet, as given.  Your physician has requested that you have an echocardiogram. Echocardiography is a painless test that uses sound waves to create images of your heart. It provides your doctor with information about the size and shape of your heart and how well your heart's chambers and valves are working. This procedure takes approximately one hour. There are no restrictions for this procedure.  Follow-Up: Your physician recommends that you schedule a follow-up appointment as needed with Dr Burt Knack.    Any Other Special Instructions Will Be Listed Below (If Applicable).     If you need a refill on your cardiac medications before your next appointment, please call your pharmacy.

## 2017-04-23 NOTE — Progress Notes (Addendum)
Cardiology Office Note Date:  04/25/2017   ID:  Katie Shepard, DOB 1956-06-29, MRN [id number]  PCP:  Burnard Bunting, MD  Cardiologist:  Sherren Mocha, MD    Chief Complaint  Patient presents with  . New Patient (Initial Visit)    exertional chest pain     History of Present Illness: Katie Shepard is a 61 y.o. female who presents for evaluation of chest pain. She is referred by Dr Reynaldo Minium and his note is reviewed.  She complains of chest pain with physical exertion. She describes symptoms with lifting heavy furniture or with running. Feels a tightness in the center of her chest, with resolution of her symptoms when she stops to rest. Pain radiates to the neck but not the shoulders, arms, or back. She also complains of shortness of breath with exertion. She swims for exercise and over the last 6-12 months she's had shortness of breath associated with that. She has not had symptoms of chest pain with normal or even moderate activity such as carrying groceries or walking on level ground.   Also complains of anxiety and has chest tightness when she is feeling anxious. No orthopnea, PND, or resting symptoms.    Past Medical History:  Diagnosis Date  . Elevated cholesterol   . Fibromyalgia   . Leiomyoma    Multiple small on ultrasound  . Migraine     Past Surgical History:  Procedure Laterality Date  . ANKLE SURGERY  1992   RIGHT ANKLE SURGERY FOR FRACTURE  . TONSILLECTOMY      Current Outpatient Prescriptions  Medication Sig Dispense Refill  . ALPRAZolam (XANAX) 0.25 MG tablet Take 0.25 mg by mouth at bedtime as needed for sleep.    Marland Kitchen atenolol (TENORMIN) 25 MG tablet Take 25 mg by mouth daily.    Marland Kitchen atorvastatin (LIPITOR) 20 MG tablet Take 20 mg by mouth daily.  5  . Cholecalciferol (VITAMIN D PO) Take 2 tablets by mouth daily.     . Coenzyme Q10 (CO Q 10 PO) Take 1 capsule by mouth daily.    Marland Kitchen estradiol (ESTRACE) 1 MG tablet Take 1 tablet (1 mg total) by mouth  daily. 30 tablet 12  . FLUoxetine (PROZAC) 10 MG capsule Take 10 mg by mouth daily.    Marland Kitchen HYDROcodone-acetaminophen (NORCO/VICODIN) 5-325 MG tablet Take 1-2 tablets by mouth daily as needed for migraine.  0  . Loratadine (CLARITIN PO) Take 10 mg by mouth daily.     . multivitamin-lutein (OCUVITE-LUTEIN) CAPS capsule Take 1 capsule by mouth daily.    . progesterone (PROMETRIUM) 100 MG capsule TAKE 1 CAPSULE AT BEDTIME. 30 capsule 12  . zolmitriptan (ZOMIG) 5 MG nasal solution Place 1 spray into the nose daily as needed for migraine.      No current facility-administered medications for this visit.     Allergies:   Patient has no known allergies.   Social History:  The patient  reports that she has never smoked. She has never used smokeless tobacco. She reports that she drinks alcohol. She reports that she does not use drugs.   Family History:  The patient's family history includes Aortic stenosis in her father; Heart disease in her father; Hypertension in her father; Obesity in her sister; Stroke in her mother.   ROS:  Please see the history of present illness.  Otherwise, review of systems is positive for fatigue, leg swelling, visual floaters, muscle pain, dizziness, excessive sweating, palpitations, wheezing, headaches.  All other systems  are reviewed and negative.   PHYSICAL EXAM: VS:  BP 126/88   Pulse 72   Ht 5\' 4"  (1.626 m)   Wt 187 lb 12.8 oz (85.2 kg)   BMI 32.24 kg/m  , BMI Body mass index is 32.24 kg/m. GEN: Well nourished, well developed, in no acute distress  HEENT: normal  Neck: no JVD, no masses. No carotid bruits Cardiac: RRR without murmur or gallop                Respiratory:  clear to auscultation bilaterally, normal work of breathing GI: soft, nontender, nondistended, + BS MS: no deformity or atrophy  Ext: no pretibial edema, pedal pulses 2+= bilaterally Skin: warm and dry, no rash Neuro:  Strength and sensation are intact Psych: euthymic mood, full  affect  EKG:  EKG is ordered today. The ekg ordered today shows NSR 72 bpm, within normal limits  Recent Labs: No results found for requested labs within last 8760 hours.   Lipid Panel     Component Value Date/Time   CHOL 282 (H) 03/27/2016 0947   TRIG 466 (H) 03/27/2016 0947   HDL 37 (L) 03/27/2016 0947   CHOLHDL 7.6 (H) 03/27/2016 0947   VLDL NOT CALC 03/27/2016 0947   LDLCALC NOT CALC 03/27/2016 0947      Wt Readings from Last 3 Encounters:  04/23/17 187 lb 12.8 oz (85.2 kg)  03/22/17 186 lb (84.4 kg)  03/12/16 189 lb (85.7 kg)    ASSESSMENT AND PLAN: 1.  Chest pain: exertional chest discomfort radiating to the neck. Fairly typical symptoms for angina. Risk factors include HTN and hyperlipidemia (both treated and controlled). No smoking hx and no family hx of premature CAD. Recommend exercise myoview scan for risk stratification/ischemia evaluation.  2. Shortness of breath: normal exam. Recommend check echo to evaluate for LV systolic/diastolic dysfunction in setting obesity and HTN. Suspect deconditioning is primary issue.   3. HTN: BP controlled on current Rx.  4. Hyperlipidemia: treated by Dr Reynaldo Minium - atorvastatin 20 mg. Lipids reviewed.   Current medicines are reviewed with the patient today.  The patient does not have concerns regarding medicines.  Labs/ tests ordered today include:   Orders Placed This Encounter  Procedures  . Myocardial Perfusion Imaging  . EKG 12-Lead  . ECHOCARDIOGRAM COMPLETE    Disposition:   FU pending test results. If Myoview and echo without significant abnormalities, will plan to see back as needed.   Deatra James, MD  04/25/2017 6:39 AM    [address], [place], [place]  [address] Phone: [phone]; Fax: [phone]

## 2017-04-24 ENCOUNTER — Other Ambulatory Visit: Payer: Self-pay | Admitting: Gynecology

## 2017-04-24 DIAGNOSIS — N950 Postmenopausal bleeding: Secondary | ICD-10-CM

## 2017-05-08 ENCOUNTER — Ambulatory Visit (INDEPENDENT_AMBULATORY_CARE_PROVIDER_SITE_OTHER): Payer: BLUE CROSS/BLUE SHIELD

## 2017-05-08 ENCOUNTER — Ambulatory Visit (INDEPENDENT_AMBULATORY_CARE_PROVIDER_SITE_OTHER): Payer: BLUE CROSS/BLUE SHIELD | Admitting: Gynecology

## 2017-05-08 VITALS — BP 134/80

## 2017-05-08 DIAGNOSIS — N950 Postmenopausal bleeding: Secondary | ICD-10-CM | POA: Diagnosis not present

## 2017-05-08 DIAGNOSIS — D259 Leiomyoma of uterus, unspecified: Secondary | ICD-10-CM

## 2017-05-08 DIAGNOSIS — N840 Polyp of corpus uteri: Secondary | ICD-10-CM

## 2017-05-08 DIAGNOSIS — N926 Irregular menstruation, unspecified: Secondary | ICD-10-CM | POA: Diagnosis not present

## 2017-05-08 NOTE — Patient Instructions (Signed)
Office will call you to arrange surgery and with the biopsy results from the ultrasound.

## 2017-05-08 NOTE — Progress Notes (Signed)
    Katie Shepard August 12, 1956 [id number]        61 y.o.  Katie Shepard presents for sonohysterogram. Patient is on HRT to include estradiol 1 mg daily and Prometrium 100 mg nightly.  Has been bleeding on and off for the last several months to include menses-like flow. Prior sonohysterogram 2016 showed empty cavity with multiple small myomas. Otherwise doing well without significant hot flushes or night sweats.  Past medical history,surgical history, problem list, medications, allergies, family history and social history were all reviewed and documented in the EPIC chart.  Directed ROS with pertinent positives and negatives documented in the history of present illness/assessment and plan.  Exam: [place] assistant Vitals:   05/08/17 1740  BP: 134/80   General appearance:  Normal Abdomen soft nontender without masses guarding rebound Pelvic external BUS vagina with atrophic changes. Cervix with atrophic changes. Uterus grossly normal size midline mobile nontender. Adnexa without masses or tenderness.  Ultrasound: Transvaginal and transabdominal shows uterus generous in size with multiple small myomas measuring 28 mm, 27 mm, 27 mm, 15 mm, 13 mm. Somewhat arcuate appearance. Endometrial echo 9.2 mm. Right and left ovaries normal. Cul-de-sac negative.  Sonohysterogram performed, sterile technique, easy catheter introduction, good distention with 2 sessile appearing defects 14 x 7 x 13 mm and 12 x 5 x 16 mm. Endometrial sample taken. Patient tolerated well.  Assessment/Plan:  61 y.o. [id number] with continued irregular bleeding on HRT. Ultrasound shows multiple small myomas. Sonohysterogram shows 2 defects consistent with sessile polyps. Reviewed situation with the patient and my recommendations to proceed with hysteroscopy D&C with resection of endometrial polyps. Patient agrees with the plan. I reviewed the proposed surgery with the patient to include the expected intraoperative and postoperative  courses as well as the recovery period. The use of the hysteroscope, resectoscope and the D&C portion were all discussed. The risks of surgery to include infection, prolonged antibiotics, hemorrhage necessitating transfusion and the risks of transfusion. The risk of damage to internal organs during the procedure, either immediately recognized or delay recognized, including vagina, cervix, uterus, possible perforation causing damage to bowel, bladder, ureters, vessels and nerves necessitating major exploratory reparative surgery and future reparative surgeries including bladder repair, ureteral damage repair, bowel resection, ostomy formation was also discussed understood and accepted. She understands there are no guarantees that this will relieve her irregular bleeding. The patient's questions were answered to her satisfaction and she is ready to schedule surgery.      Anastasio Auerbach MD, 5:41 PM 05/08/2017

## 2017-05-09 ENCOUNTER — Telehealth: Payer: Self-pay

## 2017-05-09 ENCOUNTER — Encounter: Payer: Self-pay | Admitting: Gynecology

## 2017-05-09 DIAGNOSIS — N938 Other specified abnormal uterine and vaginal bleeding: Secondary | ICD-10-CM | POA: Diagnosis not present

## 2017-05-09 MED ORDER — MISOPROSTOL 200 MCG PO TABS: ORAL_TABLET | ORAL | 0 refills | Status: DC

## 2017-05-09 NOTE — Telephone Encounter (Signed)
I contacted patient to discuss scheduling surgery. We discussed her ins benefits and he estimated surgery prepymt due by one week prior to surgery.  I scheduled patient for Weds., Aug 29 1:00pm at [facility].  I will mail her a financial letter and [facility] pamphlet.  I advised her regarding need for Cytotec tab vaginally hs before surgery. Rx sent.

## 2017-05-09 NOTE — Addendum Note (Signed)
Addended by: Nelva Nay on: 05/09/2017 08:57 AM   Modules accepted: Orders

## 2017-05-13 ENCOUNTER — Encounter: Payer: Self-pay | Admitting: Gynecology

## 2017-05-13 LAB — PATHOLOGY

## 2017-05-15 ENCOUNTER — Telehealth (HOSPITAL_COMMUNITY): Payer: Self-pay | Admitting: Radiology

## 2017-05-15 ENCOUNTER — Telehealth (HOSPITAL_COMMUNITY): Payer: Self-pay | Admitting: *Deleted

## 2017-05-15 NOTE — Telephone Encounter (Signed)
Left message on voicemail in reference to upcoming appointment scheduled for 05/17/17. Phone number given for a call back so details instructions can be given.  Katie Shepard

## 2017-05-17 ENCOUNTER — Encounter (HOSPITAL_COMMUNITY): Payer: Self-pay

## 2017-05-17 ENCOUNTER — Ambulatory Visit (HOSPITAL_COMMUNITY): Payer: BLUE CROSS/BLUE SHIELD | Attending: Cardiovascular Disease

## 2017-05-17 ENCOUNTER — Ambulatory Visit (HOSPITAL_BASED_OUTPATIENT_CLINIC_OR_DEPARTMENT_OTHER): Payer: BLUE CROSS/BLUE SHIELD

## 2017-05-17 ENCOUNTER — Other Ambulatory Visit: Payer: Self-pay

## 2017-05-17 DIAGNOSIS — M797 Fibromyalgia: Secondary | ICD-10-CM | POA: Diagnosis not present

## 2017-05-17 DIAGNOSIS — I10 Essential (primary) hypertension: Secondary | ICD-10-CM | POA: Diagnosis not present

## 2017-05-17 DIAGNOSIS — R079 Chest pain, unspecified: Secondary | ICD-10-CM

## 2017-05-17 DIAGNOSIS — I447 Left bundle-branch block, unspecified: Secondary | ICD-10-CM | POA: Diagnosis not present

## 2017-05-17 DIAGNOSIS — E785 Hyperlipidemia, unspecified: Secondary | ICD-10-CM | POA: Diagnosis not present

## 2017-05-17 DIAGNOSIS — R0602 Shortness of breath: Secondary | ICD-10-CM | POA: Diagnosis not present

## 2017-05-17 MED ORDER — PERFLUTREN LIPID MICROSPHERE
1.0000 mL | INTRAVENOUS | Status: AC | PRN
  Administered 2017-05-17: 2 mL via INTRAVENOUS

## 2017-05-17 MED ORDER — TECHNETIUM TC 99M TETROFOSMIN IV KIT
32.6000 | PACK | Freq: Once | INTRAVENOUS | Status: AC | PRN
  Administered 2017-05-17: 32.6 via INTRAVENOUS
  Filled 2017-05-17: qty 33

## 2017-05-17 MED ORDER — TECHNETIUM TC 99M TETROFOSMIN IV KIT
10.7000 | PACK | Freq: Once | INTRAVENOUS | Status: AC | PRN
  Administered 2017-05-17: 10.7 via INTRAVENOUS
  Filled 2017-05-17: qty 11

## 2017-05-20 ENCOUNTER — Ambulatory Visit (INDEPENDENT_AMBULATORY_CARE_PROVIDER_SITE_OTHER): Payer: BLUE CROSS/BLUE SHIELD | Admitting: Gynecology

## 2017-05-20 ENCOUNTER — Encounter: Payer: Self-pay | Admitting: Gynecology

## 2017-05-20 VITALS — BP 130/84

## 2017-05-20 DIAGNOSIS — Z79890 Hormone replacement therapy (postmenopausal): Secondary | ICD-10-CM

## 2017-05-20 DIAGNOSIS — N950 Postmenopausal bleeding: Secondary | ICD-10-CM | POA: Diagnosis not present

## 2017-05-20 LAB — MYOCARDIAL PERFUSION IMAGING
CSEPEDS: 0 s
CSEPHR: 97 %
Estimated workload: 7 METS
Exercise duration (min): 6 min
LVDIAVOL: 67 mL (ref 46–106)
LVSYSVOL: 17 mL
MPHR: 161 {beats}/min
NUC STRESS TID: 1.1
Peak HR: 157 {beats}/min
RATE: 0.34
Rest HR: 68 {beats}/min
SDS: 1
SRS: 6
SSS: 4

## 2017-05-20 NOTE — H&P (Signed)
Katie Shepard 10/09/1955 [id number]   History and Physical  Chief complaint: Postmenopausal bleeding, endometrial polyps, hormone replacement therapy  History of present illness: 61 y.o. [id number] with a history of hormone replacement therapy to include estradiol 1 mg daily and Prometrium 100 mg nightly. Started bleeding on and off over the last several months to include heavier menses like flows. History of prior sonohysterogram 2016 showing an empty cavity with several small myomas.  Most recent ultrasound shows multiple small myomas with endometrial echo 9.2 mm. 2 sessile defects noted 14 x 7 x 13 mm and 12 x 5 x 16 mm. Endometrial sample showed benign endometrium. Patient admitted for hysteroscopy, D&C with resection of the endometrial defects.  Past medical history,surgical history, medications, allergies, family history and social history were all reviewed and documented in the EPIC chart.  ROS:  Was performed and pertinent positives and negatives are included in the history of present illness.  Exam:  [name] assistant Vitals:   05/20/17 1220  BP: 130/84   General: well developed, well nourished female, no acute distress HEENT: normal  Lungs: clear to auscultation without wheezing, rales or rhonchi  Cardiac: regular rate without rubs, murmurs or gallops  Abdomen: soft, nontender without masses, guarding, rebound, organomegaly  Pelvic: external bus vagina: normal with atrophic changes Cervix: grossly normal with atrophic changes Uterus: normal size, midline and mobile, nontender  Adnexa: without masses or tenderness     Assessment/Plan:  61 y.o. [id number] with continued bleeding on HRT. Sonohysterogram shows multiple small myomas with endometrial echo 9.2 mm and 2 sessile-like defects within the endometrial cavity. Patient is admitted for hysteroscopy D&C with resection of the endometrial defects.  I reviewed the proposed surgery with the patient to include the expected  intraoperative and postoperative courses as well as the recovery period. The use of the hysteroscope, resectoscope and the D&C portion were all discussed. The risks of surgery to include infection, prolonged antibiotics, hemorrhage necessitating transfusion and the risks of transfusion, including transfusion reaction, hepatitis, HIV, mad cow disease and other unknown entities were all discussed understood and accepted. The risk of damage to internal organs during the procedure, either immediately recognized or delay recognized, including vagina, cervix, uterus, possible perforation causing damage to bowel, bladder, ureters, vessels and nerves necessitating major exploratory reparative surgery and future reparative surgeries including bladder repair, ureteral damage repair, bowel resection, ostomy formation was also discussed understood and accepted. She understands there are no guarantees that this will relieve her irregular bleeding. The patient's questions were answered to her satisfaction and she is ready to proceed with surgery.    Anastasio Auerbach MD, 12:39 PM 05/20/2017

## 2017-05-20 NOTE — Progress Notes (Signed)
Katie Shepard 05-06-1956 [id number]   Preoperative consult  Chief complaint: Postmenopausal bleeding, endometrial polyps, hormone replacement therapy  History of present illness: 61 y.o. [id number] with a history of hormone replacement therapy to include estradiol 1 mg daily and Prometrium 100 mg nightly. Started bleeding on and off over the last several months to include heavier menses like flows. History of prior sonohysterogram 2016 showing an empty cavity with several small myomas.  Most recent ultrasound shows multiple small myomas with endometrial echo 9.2 mm. 2 sessile defects noted 14 x 7 x 13 mm and 12 x 5 x 16 mm. Endometrial sample showed benign endometrium. Patient admitted for hysteroscopy, D&C with resection of the endometrial defects.  Past medical history,surgical history, medications, allergies, family history and social history were all reviewed and documented in the EPIC chart.  ROS:  Was performed and pertinent positives and negatives are included in the history of present illness.  Exam:  Katie Shepard assistant Vitals:   05/20/17 1220  BP: 130/84   General: well developed, well nourished female, no acute distress HEENT: normal  Lungs: clear to auscultation without wheezing, rales or rhonchi  Cardiac: regular rate without rubs, murmurs or gallops  Abdomen: soft, nontender without masses, guarding, rebound, organomegaly  Pelvic: external bus vagina: normal with atrophic changes Cervix: grossly normal with atrophic changes Uterus: normal size, midline and mobile, nontender  Adnexa: without masses or tenderness     Assessment/Plan:  61 y.o. [id number] with continued bleeding on HRT. Sonohysterogram shows multiple small myomas with endometrial echo 9.2 mm and 2 sessile-like defects within the endometrial cavity. Patient is admitted for hysteroscopy D&C with resection of the endometrial defects.  I reviewed the proposed surgery with the patient to include the expected  intraoperative and postoperative courses as well as the recovery period. The use of the hysteroscope, resectoscope and the D&C portion were all discussed. The risks of surgery to include infection, prolonged antibiotics, hemorrhage necessitating transfusion and the risks of transfusion, including transfusion reaction, hepatitis, HIV, mad cow disease and other unknown entities were all discussed understood and accepted. The risk of damage to internal organs during the procedure, either immediately recognized or delay recognized, including vagina, cervix, uterus, possible perforation causing damage to bowel, bladder, ureters, vessels and nerves necessitating major exploratory reparative surgery and future reparative surgeries including bladder repair, ureteral damage repair, bowel resection, ostomy formation was also discussed understood and accepted. She understands there are no guarantees that this will relieve her irregular bleeding. The patient's questions were answered to her satisfaction and she is ready to proceed with surgery.     Katie Auerbach MD, 12:33 PM 05/20/2017

## 2017-05-20 NOTE — Patient Instructions (Signed)
Followup for surgery as scheduled. 

## 2017-05-21 ENCOUNTER — Encounter (HOSPITAL_BASED_OUTPATIENT_CLINIC_OR_DEPARTMENT_OTHER): Payer: Self-pay | Admitting: *Deleted

## 2017-05-21 NOTE — Progress Notes (Signed)
NPO AFTER MN W/ EXCEPTION CLEAR LIQUIDS UNTIL 0700 (NO CREAM [employer] PRODUCTS).  ARRIVE AT 1130.  GETTING LAB WORK DONE PRIOR TO DOS (CBC, CMET).  CURRENT EKG IN CHART AND EPIC. WILL TAKE PRILOSEC AM DOS W/ SIPS OF WATER.

## 2017-05-22 ENCOUNTER — Telehealth: Payer: Self-pay | Admitting: Cardiovascular Disease

## 2017-05-22 NOTE — Telephone Encounter (Signed)
Spoke with pt and made her aware that we are just waiting for Dr. Burt Knack to review results.  Pt verbalized understanding.

## 2017-05-22 NOTE — Telephone Encounter (Signed)
New message    Pt is calling about her test results. She is asking for a call back.

## 2017-05-27 ENCOUNTER — Encounter (HOSPITAL_BASED_OUTPATIENT_CLINIC_OR_DEPARTMENT_OTHER)
Admission: RE | Admit: 2017-05-27 | Discharge: 2017-05-27 | Disposition: A | Discharge status: Home / Self Care | Payer: BLUE CROSS/BLUE SHIELD | Source: Ambulatory Visit | Attending: Gynecology | Admitting: Gynecology

## 2017-05-27 DIAGNOSIS — N952 Postmenopausal atrophic vaginitis: Secondary | ICD-10-CM | POA: Diagnosis not present

## 2017-05-27 DIAGNOSIS — N840 Polyp of corpus uteri: Secondary | ICD-10-CM | POA: Diagnosis not present

## 2017-05-27 DIAGNOSIS — N950 Postmenopausal bleeding: Secondary | ICD-10-CM | POA: Diagnosis not present

## 2017-05-27 DIAGNOSIS — Z79890 Hormone replacement therapy (postmenopausal): Secondary | ICD-10-CM | POA: Diagnosis not present

## 2017-05-27 LAB — CBC
HEMATOCRIT: 38.4 % (ref 36.0–46.0)
HEMOGLOBIN: 13.5 g/dL (ref 12.0–15.0)
MCH: 31.8 pg (ref 26.0–34.0)
MCHC: 35.2 g/dL (ref 30.0–36.0)
MCV: 90.4 fL (ref 78.0–100.0)
Platelets: 231 10*3/uL (ref 150–400)
RBC: 4.25 MIL/uL (ref 3.87–5.11)
RDW: 12.4 % (ref 11.5–15.5)
WBC: 6.7 10*3/uL (ref 4.0–10.5)

## 2017-05-27 LAB — COMPREHENSIVE METABOLIC PANEL
ALK PHOS: 74 U/L (ref 38–126)
ALT: 48 U/L (ref 14–54)
AST: 47 U/L — AB (ref 15–41)
Albumin: 3.9 g/dL (ref 3.5–5.0)
Anion gap: 6 (ref 5–15)
BILIRUBIN TOTAL: 0.5 mg/dL (ref 0.3–1.2)
BUN: 15 mg/dL (ref 6–20)
CALCIUM: 9 mg/dL (ref 8.9–10.3)
CHLORIDE: 108 mmol/L (ref 101–111)
CO2: 23 mmol/L (ref 22–32)
CREATININE: 0.84 mg/dL (ref 0.44–1.00)
GFR calc Af Amer: >60 mL/min (ref >60)
Glucose, Bld: 102 mg/dL — ABNORMAL HIGH (ref 65–99)
Potassium: 4.4 mmol/L (ref 3.5–5.1)
Sodium: 137 mmol/L (ref 135–145)
TOTAL PROTEIN: 7.1 g/dL (ref 6.5–8.1)

## 2017-05-29 ENCOUNTER — Ambulatory Visit (HOSPITAL_BASED_OUTPATIENT_CLINIC_OR_DEPARTMENT_OTHER)
Admission: RE | Admit: 2017-05-29 | Discharge: 2017-05-29 | Disposition: A | Discharge status: Home / Self Care | Payer: BLUE CROSS/BLUE SHIELD | Source: Ambulatory Visit | Attending: Gynecology | Admitting: Gynecology

## 2017-05-29 ENCOUNTER — Encounter (HOSPITAL_BASED_OUTPATIENT_CLINIC_OR_DEPARTMENT_OTHER)
Admission: RE | Disposition: A | Discharge status: Home / Self Care | Payer: Self-pay | Source: Ambulatory Visit | Attending: Gynecology

## 2017-05-29 ENCOUNTER — Encounter (HOSPITAL_BASED_OUTPATIENT_CLINIC_OR_DEPARTMENT_OTHER): Payer: Self-pay | Admitting: *Deleted

## 2017-05-29 ENCOUNTER — Ambulatory Visit (HOSPITAL_BASED_OUTPATIENT_CLINIC_OR_DEPARTMENT_OTHER): Payer: BLUE CROSS/BLUE SHIELD | Admitting: Anesthesiology

## 2017-05-29 DIAGNOSIS — N840 Polyp of corpus uteri: Secondary | ICD-10-CM | POA: Diagnosis not present

## 2017-05-29 DIAGNOSIS — N952 Postmenopausal atrophic vaginitis: Secondary | ICD-10-CM | POA: Insufficient documentation

## 2017-05-29 DIAGNOSIS — M797 Fibromyalgia: Secondary | ICD-10-CM | POA: Diagnosis not present

## 2017-05-29 DIAGNOSIS — Z79890 Hormone replacement therapy (postmenopausal): Secondary | ICD-10-CM | POA: Insufficient documentation

## 2017-05-29 DIAGNOSIS — K219 Gastro-esophageal reflux disease without esophagitis: Secondary | ICD-10-CM | POA: Diagnosis not present

## 2017-05-29 DIAGNOSIS — N950 Postmenopausal bleeding: Secondary | ICD-10-CM | POA: Insufficient documentation

## 2017-05-29 DIAGNOSIS — N926 Irregular menstruation, unspecified: Secondary | ICD-10-CM | POA: Diagnosis not present

## 2017-05-29 HISTORY — PX: DILATATION & CURETTAGE/HYSTEROSCOPY WITH MYOSURE: SHX6511

## 2017-05-29 HISTORY — DX: Presence of spectacles and contact lenses: Z97.3

## 2017-05-29 HISTORY — DX: Unspecified osteoarthritis, unspecified site: M19.90

## 2017-05-29 HISTORY — DX: Hyperlipidemia, unspecified: E78.5

## 2017-05-29 HISTORY — DX: Postmenopausal bleeding: N95.0

## 2017-05-29 HISTORY — DX: Polyp of corpus uteri: N84.0

## 2017-05-29 HISTORY — DX: Gastro-esophageal reflux disease without esophagitis: K21.9

## 2017-05-29 SURGERY — DILATATION & CURETTAGE/HYSTEROSCOPY WITH MYOSURE
Anesthesia: General | Site: Vagina

## 2017-05-29 MED ORDER — ONDANSETRON HCL 4 MG/2ML IJ SOLN
INTRAMUSCULAR | Status: DC | PRN
  Administered 2017-05-29: 4 mg via INTRAVENOUS

## 2017-05-29 MED ORDER — KETOROLAC TROMETHAMINE 30 MG/ML IJ SOLN
INTRAMUSCULAR | Status: DC | PRN
  Administered 2017-05-29: 30 mg via INTRAVENOUS

## 2017-05-29 MED ORDER — OXYCODONE HCL 5 MG PO TABS
ORAL_TABLET | ORAL | Status: AC
  Filled 2017-05-29: qty 1

## 2017-05-29 MED ORDER — OXYCODONE HCL 5 MG/5ML PO SOLN
5.0000 mg | Freq: Once | ORAL | Status: AC | PRN
  Filled 2017-05-29: qty 5

## 2017-05-29 MED ORDER — LIDOCAINE HCL 1 % IJ SOLN
INTRAMUSCULAR | Status: DC | PRN
  Administered 2017-05-29: 10 mL

## 2017-05-29 MED ORDER — DEXAMETHASONE SODIUM PHOSPHATE 10 MG/ML IJ SOLN
INTRAMUSCULAR | Status: AC
  Filled 2017-05-29: qty 1

## 2017-05-29 MED ORDER — PROPOFOL 10 MG/ML IV BOLUS
INTRAVENOUS | Status: AC
  Filled 2017-05-29: qty 20

## 2017-05-29 MED ORDER — EPHEDRINE SULFATE 50 MG/ML IJ SOLN
INTRAMUSCULAR | Status: DC | PRN
  Administered 2017-05-29 (×2): 10 mg via INTRAVENOUS

## 2017-05-29 MED ORDER — PROMETHAZINE HCL 25 MG/ML IJ SOLN
6.2500 mg | INTRAMUSCULAR | Status: DC | PRN
  Filled 2017-05-29: qty 1

## 2017-05-29 MED ORDER — KETOROLAC TROMETHAMINE 30 MG/ML IJ SOLN
INTRAMUSCULAR | Status: AC
  Filled 2017-05-29: qty 1

## 2017-05-29 MED ORDER — MIDAZOLAM HCL 2 MG/2ML IJ SOLN
INTRAMUSCULAR | Status: DC | PRN
  Administered 2017-05-29: 2 mg via INTRAVENOUS

## 2017-05-29 MED ORDER — ONDANSETRON HCL 4 MG/2ML IJ SOLN
INTRAMUSCULAR | Status: AC
  Filled 2017-05-29: qty 2

## 2017-05-29 MED ORDER — DEXTROSE 5 % IV SOLN
2.0000 g | INTRAVENOUS | Status: AC
  Administered 2017-05-29: 2 g via INTRAVENOUS
  Filled 2017-05-29: qty 2

## 2017-05-29 MED ORDER — MIDAZOLAM HCL 2 MG/2ML IJ SOLN
INTRAMUSCULAR | Status: AC
  Filled 2017-05-29: qty 2

## 2017-05-29 MED ORDER — FENTANYL CITRATE (PF) 100 MCG/2ML IJ SOLN
INTRAMUSCULAR | Status: DC | PRN
  Administered 2017-05-29 (×2): 50 ug via INTRAVENOUS

## 2017-05-29 MED ORDER — OXYCODONE-ACETAMINOPHEN 5-325 MG PO TABS: 1.0000 | ORAL_TABLET | ORAL | 0 refills | Status: DC | PRN

## 2017-05-29 MED ORDER — HYDROMORPHONE HCL-NACL 0.5-0.9 MG/ML-% IV SOSY
PREFILLED_SYRINGE | INTRAVENOUS | Status: AC
  Filled 2017-05-29: qty 1

## 2017-05-29 MED ORDER — MEPERIDINE HCL 25 MG/ML IJ SOLN
6.2500 mg | INTRAMUSCULAR | Status: DC | PRN
  Filled 2017-05-29: qty 1

## 2017-05-29 MED ORDER — CEFOTETAN DISODIUM-DEXTROSE 2-2.08 GM-% IV SOLR
INTRAVENOUS | Status: AC
  Filled 2017-05-29: qty 50

## 2017-05-29 MED ORDER — LIDOCAINE HCL (CARDIAC) 20 MG/ML IV SOLN
INTRAVENOUS | Status: DC | PRN
  Administered 2017-05-29: 50 mg via INTRAVENOUS

## 2017-05-29 MED ORDER — SODIUM CHLORIDE 0.9 % IR SOLN
Status: DC | PRN
  Administered 2017-05-29: 3000 mL

## 2017-05-29 MED ORDER — LIDOCAINE 2% (20 MG/ML) 5 ML SYRINGE
INTRAMUSCULAR | Status: AC
  Filled 2017-05-29: qty 5

## 2017-05-29 MED ORDER — DEXAMETHASONE SODIUM PHOSPHATE 10 MG/ML IJ SOLN
INTRAMUSCULAR | Status: DC | PRN
  Administered 2017-05-29: 4 mg via INTRAVENOUS

## 2017-05-29 MED ORDER — FENTANYL CITRATE (PF) 100 MCG/2ML IJ SOLN
INTRAMUSCULAR | Status: AC
Start: 2017-05-29 — End: 2017-05-29
  Filled 2017-05-29: qty 2

## 2017-05-29 MED ORDER — LACTATED RINGERS IV SOLN
INTRAVENOUS | Status: DC
  Administered 2017-05-29 (×3): via INTRAVENOUS
  Filled 2017-05-29: qty 1000

## 2017-05-29 MED ORDER — OXYCODONE HCL 5 MG PO TABS
5.0000 mg | ORAL_TABLET | Freq: Once | ORAL | Status: AC | PRN
  Administered 2017-05-29: 5 mg via ORAL
  Filled 2017-05-29: qty 1

## 2017-05-29 MED ORDER — PROPOFOL 10 MG/ML IV BOLUS
INTRAVENOUS | Status: DC | PRN
  Administered 2017-05-29: 160 mg via INTRAVENOUS

## 2017-05-29 MED ORDER — HYDROMORPHONE HCL 1 MG/ML IJ SOLN
0.2500 mg | INTRAMUSCULAR | Status: DC | PRN
  Administered 2017-05-29: 0.25 mg via INTRAVENOUS
  Filled 2017-05-29: qty 0.5

## 2017-05-29 SURGICAL SUPPLY — 26 items
CANISTER SUCT 3000ML PPV (MISCELLANEOUS) ×2 IMPLANT
CATH ROBINSON RED A/P 16FR (CATHETERS) ×2 IMPLANT
CLOTH BEACON ORANGE TIMEOUT ST (SAFETY) ×2 IMPLANT
COUNTER NEEDLE 1200 MAGNETIC (NEEDLE) ×2 IMPLANT
DEVICE MYOSURE LITE (MISCELLANEOUS) ×1 IMPLANT
DEVICE MYOSURE REACH (MISCELLANEOUS) IMPLANT
DILATOR CANAL MILEX (MISCELLANEOUS) IMPLANT
FILTER ARTHROSCOPY CONVERTOR (FILTER) ×2 IMPLANT
GLOVE BIO SURGEON STRL SZ7.5 (GLOVE) ×4 IMPLANT
GLOVE BIOGEL PI IND STRL 7.5 (GLOVE) IMPLANT
GLOVE BIOGEL PI INDICATOR 7.5 (GLOVE) ×3
GOWN STRL REUS W/TWL LRG LVL3 (GOWN DISPOSABLE) ×2 IMPLANT
IV NS IRRIG 3000ML ARTHROMATIC (IV SOLUTION) ×2 IMPLANT
KIT RM TURNOVER CYSTO AR (KITS) ×2 IMPLANT
MYOSURE XL FIBROID REM (MISCELLANEOUS)
NDL SAFETY ECLIPSE 18X1.5 (NEEDLE) IMPLANT
NEEDLE HYPO 18GX1.5 SHARP (NEEDLE)
PACK VAGINAL MINOR WOMEN LF (CUSTOM PROCEDURE TRAY) ×2 IMPLANT
PAD OB MATERNITY 4.3X12.25 (PERSONAL CARE ITEMS) ×2 IMPLANT
SEAL ROD LENS SCOPE MYOSURE (ABLATOR) ×2 IMPLANT
SYRINGE LUER LOK 1CC (MISCELLANEOUS) IMPLANT
SYSTEM TISS REMOVAL MYSR XL RM (MISCELLANEOUS) IMPLANT
TOWEL OR 17X24 6PK STRL BLUE (TOWEL DISPOSABLE) ×4 IMPLANT
TUBING AQUILEX INFLOW (TUBING) ×2 IMPLANT
TUBING AQUILEX OUTFLOW (TUBING) ×2 IMPLANT
WATER STERILE IRR 500ML POUR (IV SOLUTION) IMPLANT

## 2017-05-29 NOTE — Anesthesia Procedure Notes (Signed)
Procedure Name: LMA Insertion Date/Time: 05/29/2017 1:12 PM Performed by: Bufford Spikes Pre-anesthesia Checklist: Patient identified, Emergency Drugs available, Suction available and Patient being monitored Patient Re-evaluated:Patient Re-evaluated prior to induction Oxygen Delivery Method: Circle system utilized Preoxygenation: Pre-oxygenation with 100% oxygen Induction Type: IV induction Ventilation: Mask ventilation without difficulty LMA: LMA inserted LMA Size: 4.0 Tube size: 4.0 mm Number of attempts: 1 Placement Confirmation: positive ETCO2 and breath sounds checked- equal and bilateral Tube secured with: Tape Dental Injury: Teeth and Oropharynx as per pre-operative assessment

## 2017-05-29 NOTE — Transfer of Care (Signed)
Immediate Anesthesia Transfer of Care Note  Patient: Katie Shepard  Procedure(s) Performed: Procedure(s): DILATATION & CURETTAGE/HYSTEROSCOPY WITH MYOSURE (N/A)  Patient Location: PACU  Anesthesia Type:General  Level of Consciousness: awake, alert  and oriented  Airway & Oxygen Therapy: Patient Spontanous Breathing and Patient connected to nasal cannula oxygen  Post-op Assessment: Report given to RN and Post -op Vital signs reviewed and stable  Post vital signs: Reviewed and stable  Last Vitals:  Vitals:   05/29/17 1145  BP: (!) 178/85  Pulse: 65  Resp: 18  Temp: 36.6 C  SpO2: 98%    Last Pain:  Vitals:   05/29/17 1208  TempSrc:   PainSc: 4       Patients Stated Pain Goal: 4 (35/32/99 2426)  Complications: No apparent anesthesia complications

## 2017-05-29 NOTE — Discharge Instructions (Signed)

## 2017-05-29 NOTE — Op Note (Signed)
Katie Shepard 09/16/1956 [id number]   Post Operative Note   Date of surgery:  05/29/2017  Pre Op Dx:  Postmenopausal bleeding, endometrial polyps  Post Op Dx:  Postmenopausal bleeding, endometrial polyps  Procedure:  Hysteroscopy, D&C, Myosure resection of endometrial polyps  Surgeon:  Anastasio Auerbach  Anesthesia:  General  EBL:  Minimal  Distended media discrepancy:  25 cc saline  Complications:  None  Specimen:  #1 endometrial polyps #2 endometrial curetting to pathology  Findings: EUA:  External BUS vagina with atrophic changes. Cervix with atrophic changes. Uterus normal size midline mobile. Adnexa without masses   Hysteroscopy:  Adequate noting fundus, right/left tubal ostia, anterior/posterior endometrial surfaces, lower uterine segment, endocervical canal all visualized. Small polyp anterior left lower uterine segment. Sessile larger polyp left posterior lower endometrial segment. Both resected to the level of the surrounding endometrium.  Procedure:  The patient was taken to the operating room, was placed in the low dorsal lithotomy position, underwent general anesthesia, received a perineal/vaginal preparation with Betadine solution per nursing personnel and the bladder was emptied with in and out Foley catheterization. The timeout was performed by the surgical team. An EUA was performed. The patient was draped in the usual fashion. The cervix was visualized with a speculum, anterior lip grasped with a single-tooth tenaculum and a paracervical block was placed using 10 cc's of 1% lidocaine. The cervix was gently dilated to admit the Myosure hysteroscope and hysteroscopy was performed with findings noted above. Using the Myosure Lite resectoscopic wand the polyps were resected in their entirety to the level the surrounding endometrium. A gentle sharp curettage was performed. Both specimens were sent separately to pathology.  Repeat hysteroscopy showed an empty cavity with good  distention and no evidence of perforation. The instruments were removed and adequate hemostasis was visualized at the tenaculum site and external cervical os. The patient was given intraoperative Toradol, was awakened without difficulty and was taken to the recovery room in good condition having tolerated the procedure well.    Anastasio Auerbach MD, 1:38 PM 05/29/2017

## 2017-05-29 NOTE — Anesthesia Preprocedure Evaluation (Signed)
Anesthesia Evaluation  Patient identified by MRN, date of birth, ID band Patient awake    Reviewed: Allergy & Precautions, NPO status , Patient's Chart, lab work & pertinent test results  Airway Mallampati: II  TM Distance: >3 FB Neck ROM: Full    Dental no notable dental hx.    Pulmonary neg pulmonary ROS,    Pulmonary exam normal breath sounds clear to auscultation       Cardiovascular negative cardio ROS Normal cardiovascular exam Rhythm:Regular Rate:Normal     Neuro/Psych  Headaches, negative psych ROS   GI/Hepatic Neg liver ROS, GERD  ,  Endo/Other  negative endocrine ROS  Renal/GU negative Renal ROS     Musculoskeletal  (+) Arthritis , Fibromyalgia -  Abdominal   Peds  Hematology negative hematology ROS (+)   Anesthesia Other Findings   Reproductive/Obstetrics negative OB ROS                             Anesthesia Physical Anesthesia Plan  ASA: II  Anesthesia Plan: General   Post-op Pain Management:    Induction: Intravenous  PONV Risk Score and Plan: 4 or greater and Ondansetron, Dexamethasone, Midazolam and Scopolamine patch - Pre-op  Airway Management Planned:   Additional Equipment:   Intra-op Plan:   Post-operative Plan: Extubation in OR  Informed Consent: I have reviewed the patients History and Physical, chart, labs and discussed the procedure including the risks, benefits and alternatives for the proposed anesthesia with the patient or authorized representative who has indicated his/her understanding and acceptance.   Dental advisory given  Plan Discussed with: CRNA  Anesthesia Plan Comments:         Anesthesia Quick Evaluation

## 2017-05-29 NOTE — H&P (Signed)
The patient was examined.  I reviewed the proposed surgery and consent form with the patient.  The dictated history and physical is current and accurate and all questions were answered. The patient is ready to proceed with surgery and has a realistic understanding and expectation for the outcome.   Katie Auerbach MD, 12:57 PM 05/29/2017

## 2017-05-30 ENCOUNTER — Encounter (HOSPITAL_BASED_OUTPATIENT_CLINIC_OR_DEPARTMENT_OTHER): Payer: Self-pay | Admitting: Gynecology

## 2017-05-30 NOTE — Anesthesia Postprocedure Evaluation (Signed)
Anesthesia Post Note  Patient: Katie Shepard  Procedure(s) Performed: Procedure(s) (LRB): DILATATION & CURETTAGE/HYSTEROSCOPY WITH MYOSURE (N/A)     Patient location during evaluation: PACU Anesthesia Type: General Level of consciousness: sedated and patient cooperative Pain management: pain level controlled Vital Signs Assessment: post-procedure vital signs reviewed and stable Respiratory status: spontaneous breathing Cardiovascular status: stable Anesthetic complications: no    Last Vitals:  Vitals:   05/29/17 1445 05/29/17 1530  BP: (!) 161/91 (!) 148/73  Pulse: 69 71  Resp: 15 16  Temp:  36.7 C  SpO2: 96% 96%    Last Pain:  Vitals:   05/29/17 1530  TempSrc:   PainSc: [place]

## 2017-06-05 DIAGNOSIS — H2513 Age-related nuclear cataract, bilateral: Secondary | ICD-10-CM | POA: Diagnosis not present

## 2017-06-13 ENCOUNTER — Ambulatory Visit (INDEPENDENT_AMBULATORY_CARE_PROVIDER_SITE_OTHER): Payer: BLUE CROSS/BLUE SHIELD | Admitting: Gynecology

## 2017-06-13 ENCOUNTER — Encounter: Payer: Self-pay | Admitting: Gynecology

## 2017-06-13 VITALS — BP 124/80

## 2017-06-13 DIAGNOSIS — N9089 Other specified noninflammatory disorders of vulva and perineum: Secondary | ICD-10-CM

## 2017-06-13 DIAGNOSIS — Z9889 Other specified postprocedural states: Secondary | ICD-10-CM

## 2017-06-13 MED ORDER — FLUCONAZOLE 150 MG PO TABS: 150.0000 mg | ORAL_TABLET | Freq: Once | ORAL | 0 refills | Status: AC

## 2017-06-13 MED ORDER — NYSTATIN-TRIAMCINOLONE 100000-0.1 UNIT/GM-% EX OINT: 1.0000 "application " | TOPICAL_OINTMENT | Freq: Two times a day (BID) | CUTANEOUS | 0 refills | Status: DC

## 2017-06-13 NOTE — Progress Notes (Signed)
    Katie Shepard 11-11-1955 [id number]        61 y.o.  [id number] presents for her postoperative visit status post hysteroscopic resection endometrial polyps. Also complaining of vulvar irritation since the surgery. No discharge, odor, urinary symptoms to include frequency dysuria or urgency low back pain fever or chills.  Past medical history,surgical history, problem list, medications, allergies, family history and social history were all reviewed and documented in the EPIC chart.  Directed ROS with pertinent positives and negatives documented in the history of present illness/assessment and plan.  Exam: Katie Shepard assistant Vitals:   06/13/17 1213  BP: 124/80   General appearance:  Normal Abdomen soft nontender without masses guarding rebound Pelvic external BUS vagina with bilateral labia majora irritative rash consistent with fungal. Vagina without discharge. Cervix normal. Uterus grossly normal midline mobile nontender. Adnexa without masses or tenderness.  Assessment/Plan:  61 y.o. Katie Shepard with normal postoperative visit status post hysteroscopic resection endometrial polyps. Reviewed benign pathology and pictures from the surgery with her. She does have irritation on both labia majora consistent with fungal. Will treat with Mycolog cream twice daily and Diflucan 150 mg daily 3 days. She'll follow up if symptoms persist, worsen or recur. Otherwise she will follow up in June 2019 when due for annual exam.    Anastasio Auerbach MD, 12:39 PM 06/13/2017

## 2017-06-13 NOTE — Patient Instructions (Signed)
Use the prescribed cream externally twice daily for the irritation. Take the Diflucan pill daily for 3 days. Follow up if the irritation continues.

## 2017-07-18 ENCOUNTER — Other Ambulatory Visit: Payer: Self-pay | Admitting: *Deleted

## 2017-07-18 ENCOUNTER — Other Ambulatory Visit: Payer: Self-pay | Admitting: Gynecology

## 2017-07-18 NOTE — Telephone Encounter (Signed)
(  pt aware you are not in office) Patient called requesting refill on Xanax 0.25 mg, last filled in 07/2016 takes for sleep. Please advise

## 2017-07-19 MED ORDER — ALPRAZOLAM 0.25 MG PO TABS
0.2500 mg | ORAL_TABLET | Freq: Every evening | ORAL | 1 refills | Status: DC | PRN
Start: 2017-07-19 — End: 2018-01-29

## 2017-07-19 NOTE — Telephone Encounter (Signed)
Rx called in 

## 2017-08-14 DIAGNOSIS — L814 Other melanin hyperpigmentation: Secondary | ICD-10-CM | POA: Diagnosis not present

## 2017-08-14 DIAGNOSIS — D2271 Melanocytic nevi of right lower limb, including hip: Secondary | ICD-10-CM | POA: Diagnosis not present

## 2017-08-14 DIAGNOSIS — L821 Other seborrheic keratosis: Secondary | ICD-10-CM | POA: Diagnosis not present

## 2017-08-14 DIAGNOSIS — D225 Melanocytic nevi of trunk: Secondary | ICD-10-CM | POA: Diagnosis not present

## 2017-08-14 DIAGNOSIS — L820 Inflamed seborrheic keratosis: Secondary | ICD-10-CM | POA: Diagnosis not present

## 2017-10-02 DIAGNOSIS — I10 Essential (primary) hypertension: Secondary | ICD-10-CM | POA: Diagnosis not present

## 2017-10-02 DIAGNOSIS — E559 Vitamin D deficiency, unspecified: Secondary | ICD-10-CM | POA: Diagnosis not present

## 2017-10-02 DIAGNOSIS — Z0000 Encounter for general adult medical examination without abnormal findings: Secondary | ICD-10-CM | POA: Diagnosis not present

## 2017-10-02 DIAGNOSIS — R82998 Other abnormal findings in urine: Secondary | ICD-10-CM | POA: Diagnosis not present

## 2017-10-09 DIAGNOSIS — I10 Essential (primary) hypertension: Secondary | ICD-10-CM | POA: Diagnosis not present

## 2017-10-09 DIAGNOSIS — E7849 Other hyperlipidemia: Secondary | ICD-10-CM | POA: Diagnosis not present

## 2017-10-09 DIAGNOSIS — G43009 Migraine without aura, not intractable, without status migrainosus: Secondary | ICD-10-CM | POA: Diagnosis not present

## 2017-10-09 DIAGNOSIS — Z1389 Encounter for screening for other disorder: Secondary | ICD-10-CM | POA: Diagnosis not present

## 2017-10-09 DIAGNOSIS — E669 Obesity, unspecified: Secondary | ICD-10-CM | POA: Diagnosis not present

## 2017-10-09 DIAGNOSIS — Z0000 Encounter for general adult medical examination without abnormal findings: Secondary | ICD-10-CM | POA: Diagnosis not present

## 2017-10-15 DIAGNOSIS — Z1212 Encounter for screening for malignant neoplasm of rectum: Secondary | ICD-10-CM | POA: Diagnosis not present

## 2018-01-29 ENCOUNTER — Other Ambulatory Visit: Payer: Self-pay | Admitting: Gynecology

## 2018-03-06 ENCOUNTER — Telehealth: Payer: Self-pay

## 2018-03-06 ENCOUNTER — Other Ambulatory Visit: Payer: Self-pay | Admitting: Gynecology

## 2018-03-06 DIAGNOSIS — Z1231 Encounter for screening mammogram for malignant neoplasm of breast: Secondary | ICD-10-CM

## 2018-03-06 MED ORDER — PROGESTERONE MICRONIZED 200 MG PO CAPS: 200.0000 mg | ORAL_CAPSULE | Freq: Every day | ORAL | 0 refills | Status: DC

## 2018-03-06 NOTE — Telephone Encounter (Signed)
Spoke with patient and read her Dr. Dorette Grate response. She was fine with that. Rx sent for Prometrium 200 mg. She has her CE in July and will follow up then with Dr. Loetta Rough regarding bleeding.

## 2018-03-06 NOTE — Telephone Encounter (Signed)
Recommend continuing on the estradiol dose.  Increasing Prometrium from 100 mg to 200 mg nightly to see if this will not suppress bleeding.  Call after 1 to 2 months if bleeding continues.  We evaluated her to include D&C in August.  If the irregular bleeding would continue then we will need to take a look with ultrasound to rule out recurrence of polyps or other issues.

## 2018-03-06 NOTE — Telephone Encounter (Signed)
Patient called to report a heavy period last ten days. She went on to say she had a period around Christmas, Feb 17, last week of April and now.  She takes Prometrium and Estradiol tab daily.  What to rec?

## 2018-03-28 ENCOUNTER — Ambulatory Visit
Admission: RE | Admit: 2018-03-28 | Discharge: 2018-03-28 | Disposition: A | Discharge status: Home / Self Care | Payer: BLUE CROSS/BLUE SHIELD | Source: Ambulatory Visit | Attending: Gynecology | Admitting: Gynecology

## 2018-03-28 DIAGNOSIS — Z1231 Encounter for screening mammogram for malignant neoplasm of breast: Secondary | ICD-10-CM

## 2018-04-07 ENCOUNTER — Other Ambulatory Visit: Payer: Self-pay | Admitting: Gynecology

## 2018-04-10 ENCOUNTER — Ambulatory Visit: Payer: BLUE CROSS/BLUE SHIELD | Admitting: Gynecology

## 2018-04-10 ENCOUNTER — Encounter: Payer: Self-pay | Admitting: Gynecology

## 2018-04-10 VITALS — BP 118/76 | Ht 64.5 in | Wt 166.0 lb

## 2018-04-10 DIAGNOSIS — E663 Overweight: Secondary | ICD-10-CM | POA: Diagnosis not present

## 2018-04-10 DIAGNOSIS — Z8639 Personal history of other endocrine, nutritional and metabolic disease: Secondary | ICD-10-CM | POA: Diagnosis not present

## 2018-04-10 DIAGNOSIS — Z79890 Hormone replacement therapy (postmenopausal): Secondary | ICD-10-CM | POA: Diagnosis not present

## 2018-04-10 DIAGNOSIS — N926 Irregular menstruation, unspecified: Secondary | ICD-10-CM | POA: Diagnosis not present

## 2018-04-10 DIAGNOSIS — R0789 Other chest pain: Secondary | ICD-10-CM | POA: Diagnosis not present

## 2018-04-10 DIAGNOSIS — Z8262 Family history of osteoporosis: Secondary | ICD-10-CM

## 2018-04-10 DIAGNOSIS — Z01419 Encounter for gynecological examination (general) (routine) without abnormal findings: Secondary | ICD-10-CM

## 2018-04-10 DIAGNOSIS — J3089 Other allergic rhinitis: Secondary | ICD-10-CM | POA: Diagnosis not present

## 2018-04-10 MED ORDER — MEDROXYPROGESTERONE ACETATE 2.5 MG PO TABS
2.5000 mg | ORAL_TABLET | Freq: Every day | ORAL | 12 refills | Status: DC
Start: 2018-04-10 — End: 2019-02-16

## 2018-04-10 NOTE — Progress Notes (Signed)
    Katie Shepard 11-Jun-1956 [id number]        62 y.o.  [id number] for annual gynecologic exam.  Has continued to bleed on and off on HRT.  Underwent hysteroscopy D&C 05/2017 with resection of benign endometrial polyps.  She is on estradiol 1 mg and Prometrium 200 mg nightly.  Was on 100 mg but increase to 200 mg due to persistent bleeding.  This has not helped though and she has continued to bleed.  She notes irregular bleeding ever since menopause.  Past medical history,surgical history, problem list, medications, allergies, family history and social history were all reviewed and documented as reviewed in the EPIC chart.  ROS:  Performed with pertinent positives and negatives included in the history, assessment and plan.   Additional significant findings : None   Exam: [name] assistant Vitals:   04/10/18 1508  BP: 118/76  Weight: 166 lb (75.3 kg)  Height: 5' 4.5" (1.638 m)   Body mass index is 28.05 kg/m.  General appearance:  Normal affect, orientation and appearance. Skin: Grossly normal HEENT: Without gross lesions.  No cervical or supraclavicular adenopathy. Thyroid normal.  Lungs:  Clear without wheezing, rales or rhonchi Cardiac: RR, without RMG Abdominal:  Soft, nontender, without masses, guarding, rebound, organomegaly or hernia Breasts:  Examined lying and sitting without masses, retractions, discharge or axillary adenopathy. Pelvic:  Ext, BUS, Vagina: With atrophic changes  Cervix: With atrophic changes.  Pap smear done  Uterus: Anteverted, normal size, shape and contour, midline and mobile nontender   Adnexa: Without masses or tenderness    Anus and perineum: Normal   Rectovaginal: Normal sphincter tone without palpated masses or tenderness.    Assessment/Plan:  62 y.o. [id number] female for annual gynecologic exam.   1. Irregular bleeding/postmenopausal/HRT.  We discussed her ongoing irregular bleeding which in review has been going on for some time.  We  discussed options at this point to include monitoring and continuing her present regimen, changing HRT regiment such as switching progesterones and going to medroxyprogesterone, Mirena IUD for suppression, endometrial ablation recognizing possibility ongoing irregular bleeding and then cavity difficult to evaluate and ultimately hysterectomy for definitive treatment.  Patient at this point is not interested in hysterectomy or ablation.  She would prefer trial of change of progesterone.  Go ahead and continue estradiol 1 mg daily and medroxyprogesterone 2.5 mg daily stopping the Prometrium and see how she does.  Possible intermittent withdrawal such as Provera 10 mg for the first 12 days of each month also discussed.  Patient will follow-up if irregular bleeding continues. 2. Pap smear/HPV 2016.  Pap smear done today secondary to ongoing bleeding.  No history of significant abnormal Pap smears previously. 3. Mammography 03/2018.  Continue with annual mammography next year.  SBE monthly reviewed. 4. Colonoscopy 2013.  Repeat at their recommended interval. 5. DEXA 2011 normal.  Mother does have osteoporosis.  Recommend baseline DEXA now at age 40.  Patient will schedule in follow-up for this. 6. Health maintenance.  No routine lab work done as patient reports this done elsewhere.  Follow-up 1 year, sooner as needed.   Anastasio Auerbach MD, 3:41 PM 04/10/2018

## 2018-04-10 NOTE — Patient Instructions (Signed)
Followup for bone density as scheduled. 

## 2018-04-10 NOTE — Addendum Note (Signed)
Addended by: Nelva Nay on: 04/10/2018 04:26 PM   Modules accepted: Orders

## 2018-04-11 ENCOUNTER — Encounter: Payer: BLUE CROSS/BLUE SHIELD | Admitting: Gynecology

## 2018-04-11 LAB — PAP IG W/ RFLX HPV ASCU

## 2018-05-06 ENCOUNTER — Telehealth: Payer: Self-pay | Admitting: *Deleted

## 2018-05-06 ENCOUNTER — Other Ambulatory Visit: Payer: Self-pay | Admitting: Gynecology

## 2018-05-06 NOTE — Telephone Encounter (Signed)
I thought we talked about stopping the Prometrium and treatment starting medroxyprogesterone 2.5 mg daily along with her estradiol as outlined in my last annual exam note.  If she is not doing this then I would start that now.  I believe I had already called in the medroxyprogesterone.

## 2018-05-06 NOTE — Telephone Encounter (Signed)
I am sorry Dr.Fontaine this was my error I did call and speak with  Patient to confirm what dose she is taking. She was taking the medroxyprogesterone 2.5 mg daily and still having bleeding. It is not heavy, states if she is going to bled regardless of the dose she would like to be on lowest dose possible.

## 2018-05-06 NOTE — Telephone Encounter (Signed)
Patient called to follow up from [place] on 04/10/18 progesterone 200 mg at bedtime. States she is still having bleeding (not heavy)  with this dose, not sure what to do next. Was taking progesterone 100 mg dose and bleeding also. Please advise

## 2018-05-06 NOTE — Telephone Encounter (Signed)
I would try 5 mg daily.  She can start by taking 2 of the 2.5 mg and see how she does.

## 2018-05-07 NOTE — Telephone Encounter (Signed)
Patient informed with the below note.  

## 2018-05-13 ENCOUNTER — Ambulatory Visit (INDEPENDENT_AMBULATORY_CARE_PROVIDER_SITE_OTHER): Payer: BLUE CROSS/BLUE SHIELD

## 2018-05-13 ENCOUNTER — Other Ambulatory Visit: Payer: Self-pay | Admitting: Gynecology

## 2018-05-13 DIAGNOSIS — Z13820 Encounter for screening for osteoporosis: Secondary | ICD-10-CM

## 2018-05-13 DIAGNOSIS — Z8262 Family history of osteoporosis: Secondary | ICD-10-CM

## 2018-05-15 ENCOUNTER — Telehealth: Payer: Self-pay | Admitting: *Deleted

## 2018-05-15 NOTE — Telephone Encounter (Signed)
Follow up from telephone encounter on 05/06/18) Patient has been taking medroxyprogesterone 5 mg for 1 week now and bleeding has not slowed down, changing pads 3-4 times per day, noticed small clots in shower last night. Patient said she would much rather go back on Prometrium 100 mg dose as her bleeding was not everyday like this episode has been. Please advise

## 2018-05-16 MED ORDER — MEGESTROL ACETATE 20 MG PO TABS: 20.0000 mg | ORAL_TABLET | Freq: Every day | ORAL | 0 refills | Status: DC

## 2018-05-16 MED ORDER — PROGESTERONE MICRONIZED 100 MG PO CAPS: 100.0000 mg | ORAL_CAPSULE | Freq: Every day | ORAL | 11 refills | Status: DC

## 2018-05-16 NOTE — Telephone Encounter (Signed)
Patient aware, Rx sent.  

## 2018-05-16 NOTE — Telephone Encounter (Signed)
Recommend Megace 20 mg daily x14 days and then go back to Prometrium 100 mg dose.  Using a high-dose progesterone for 2 weeks hopefully will really thin out the endometrium.

## 2018-05-19 ENCOUNTER — Encounter: Payer: Self-pay | Admitting: Gynecology

## 2018-06-10 ENCOUNTER — Ambulatory Visit: Payer: BLUE CROSS/BLUE SHIELD | Admitting: Gynecology

## 2018-06-10 ENCOUNTER — Encounter: Payer: Self-pay | Admitting: Gynecology

## 2018-06-10 VITALS — BP 132/80

## 2018-06-10 DIAGNOSIS — N926 Irregular menstruation, unspecified: Secondary | ICD-10-CM

## 2018-06-10 DIAGNOSIS — D259 Leiomyoma of uterus, unspecified: Secondary | ICD-10-CM

## 2018-06-10 NOTE — Patient Instructions (Signed)
Follow up for ultrasound as scheduled 

## 2018-06-10 NOTE — Progress Notes (Signed)
    Katie Shepard [birth year] [id number]        62 y.o.  [id number] presents with continued irregular bleeding on HRT.  Underwent hysteroscopy D&C 05/2017 with resection of benign endometrial polyps.  Is on estradiol 1 mg daily.  Has tried Prometrium 100 mg and 200 mg daily with persistent bleeding.  Switch to medroxyprogesterone 2.5 mg then 5 mg daily with continued bleeding.  Placed on Megace 20 mg x 2 weeks which stopped her bleeding and now she has bleeding again since stopping the Megace.  Past medical history,surgical history, problem list, medications, allergies, family history and social history were all reviewed and documented in the EPIC chart.  Directed ROS with pertinent positives and negatives documented in the history of present illness/assessment and plan.  Exam: Copywriter, advertising Vitals:   06/10/18 0838  BP: 132/80   General appearance:  Normal Abdomen soft nontender without masses guarding rebound Pelvic external BUS vagina with light menses flow.  Cervix normal.  Uterus anteverted midline mobile nontender.  Adnexa without masses or tenderness.  Assessment/Plan:  62 y.o. [id number] with continued irregular bleeding on HRT despite trials of different regimens.  History of multiple small myomas and endometrial polyps resected last year.  Recommend starting with sonohysterogram now given it has been over a year since we looked to make sure she has not redeveloped endometrial polyps.  We discussed various changes in her HRT regiment to include increasing her estrogen to increase the estrogen/progesterone ratio or trying a different progesterone such as norethindrone or combination such as Activella.  I recommended for now that she stop her HRT and see how she feels.  She will follow-up at the sonohysterogram and then reassess at that time.  If she would develop significant menopausal symptoms such as hot flushes sweats or emotional swings she will restart her estrogen only and then  follow-up at the sonohysterogram.  I spent a total of 15 face-to-face minutes with the patient, over 50% was spent counseling and coordination of care.   Anastasio Auerbach MD, 8:53 AM 06/10/2018      Jerilynn Mages

## 2018-07-08 ENCOUNTER — Other Ambulatory Visit: Payer: Self-pay | Admitting: Gynecology

## 2018-07-08 DIAGNOSIS — N950 Postmenopausal bleeding: Secondary | ICD-10-CM

## 2018-07-11 ENCOUNTER — Telehealth: Payer: Self-pay | Admitting: *Deleted

## 2018-07-11 MED ORDER — FLUCONAZOLE 150 MG PO TABS: 150.0000 mg | ORAL_TABLET | Freq: Every day | ORAL | 0 refills | Status: DC

## 2018-07-11 NOTE — Telephone Encounter (Signed)
Yes, Fluconazole 150 mg 1 tab daily x 3.  #3

## 2018-07-11 NOTE — Telephone Encounter (Signed)
Patient informed, Rx sent.  

## 2018-07-11 NOTE — Telephone Encounter (Signed)
Dr.Fontaine patient called requesting Rx for yeast infection,vaginal itching, asked if diflucan tablet can be sent to pharmacy? Please advise

## 2018-07-14 ENCOUNTER — Other Ambulatory Visit: Payer: Self-pay | Admitting: Gynecology

## 2018-07-14 ENCOUNTER — Ambulatory Visit: Payer: BLUE CROSS/BLUE SHIELD | Admitting: Gynecology

## 2018-07-14 ENCOUNTER — Encounter: Payer: Self-pay | Admitting: Gynecology

## 2018-07-14 ENCOUNTER — Ambulatory Visit (INDEPENDENT_AMBULATORY_CARE_PROVIDER_SITE_OTHER): Payer: BLUE CROSS/BLUE SHIELD

## 2018-07-14 VITALS — BP 124/76

## 2018-07-14 DIAGNOSIS — D251 Intramural leiomyoma of uterus: Secondary | ICD-10-CM

## 2018-07-14 DIAGNOSIS — N898 Other specified noninflammatory disorders of vagina: Secondary | ICD-10-CM

## 2018-07-14 DIAGNOSIS — N950 Postmenopausal bleeding: Secondary | ICD-10-CM

## 2018-07-14 DIAGNOSIS — D259 Leiomyoma of uterus, unspecified: Secondary | ICD-10-CM

## 2018-07-14 DIAGNOSIS — N719 Inflammatory disease of uterus, unspecified: Secondary | ICD-10-CM | POA: Diagnosis not present

## 2018-07-14 DIAGNOSIS — N926 Irregular menstruation, unspecified: Secondary | ICD-10-CM

## 2018-07-14 LAB — WET PREP FOR TRICH, YEAST, CLUE

## 2018-07-14 MED ORDER — TERCONAZOLE 0.4 % VA CREA: 1.0000 | TOPICAL_CREAM | Freq: Every day | VAGINAL | 0 refills | Status: DC

## 2018-07-14 NOTE — Progress Notes (Signed)
    Katie Shepard 02/25/1956 [id number]        62 y.o.  [id number] presents for sonohysterogram.  History of irregular bleeding on HRT.  Had endometrial polyps resected 05/2017.  Most recently has had irregular bleeding on estradiol 1 mg daily and regimens to include Prometrium 100 mg nightly, 200 mg nightly, medroxyprogesterone 2.5 mg daily, 5 mg daily and ultimately Megace 20 mg x 2 weeks which stopped her bleeding.  She then stopped her hormones altogether and had unacceptable hot flushes and sweats and restarted the estradiol alone and has done no further bleeding. Patient also complaining of vaginal itching and irritation.  Had taken Diflucan prescribed by Dr Dellis Filbert but has continued to have symptoms.  Past medical history,surgical history, problem list, medications, allergies, family history and social history were all reviewed and documented in the EPIC chart.  Directed ROS with pertinent positives and negatives documented in the history of present illness/assessment and plan.  Exam: [place] assistant Vitals:   07/14/18 1618  BP: 124/76   General appearance:  Normal Abdomen soft nontender without masses guarding rebound Pelvic external BUS vagina with whitish discharge.  Cervix normal.  Uterus normal size midline mobile nontender.  Adnexa without masses or tenderness.  Ultrasound transvaginal and transabdominal shows uterus generous in size with multiple small myomas measuring 22 mm, 12 mm, 10 mm, 9 mm.  Endometrial echo 5.2 mm.  Right and left ovaries both visualized and normal.  Cul-de-sac negative.  Sonohysterogram performed, sterile technique, easy catheter introduction with adequate distention on pulsatile injections of the sterile saline with no abnormality seen.  Endometrial biopsy taken.  Patient tolerated well.    Assessment/Plan:  62 y.o. [id number] with irregular bleeding on HRT despite several different regimens of progesterone.  Try stopping hormones but had unacceptable  side effects.  Is back on estrogen alone.  We discussed various options to include reintroduction of daily progesterone or monthly intermittent progesterone or no progesterone with annual endometrial surveillance.  At this point were going to continue on estradiol 1 mg and days 1 through 12 of each month go with Prometrium 200 mg at bedtime and see how she does with this.  She is going to call me after her November progesterone to see how she is done.  She will follow-up for her biopsy results from the sonohysterogram in several days.  Patient's wet prep returned positive for yeast which explains her symptoms.  We will go ahead and treat as a resistant yeast with Terazol 7 day cream.  She will follow-up if symptoms persist, worsen or recur.    Anastasio Auerbach MD, 4:46 PM 07/14/2018

## 2018-07-14 NOTE — Patient Instructions (Signed)
Office will call you with biopsy results 

## 2018-07-17 ENCOUNTER — Telehealth: Payer: Self-pay

## 2018-07-17 NOTE — Telephone Encounter (Signed)
Called patient and let her know biopsy was benign.  She wanted me to let you know that she started with some spottting this morning.  She said she will just watch it and stay with the plan.

## 2018-08-11 ENCOUNTER — Encounter (HOSPITAL_BASED_OUTPATIENT_CLINIC_OR_DEPARTMENT_OTHER): Payer: Self-pay | Admitting: *Deleted

## 2018-08-11 ENCOUNTER — Other Ambulatory Visit: Payer: Self-pay

## 2018-08-11 ENCOUNTER — Emergency Department (HOSPITAL_BASED_OUTPATIENT_CLINIC_OR_DEPARTMENT_OTHER)
Admission: EM | Admit: 2018-08-11 | Discharge: 2018-08-11 | Disposition: A | Discharge status: Home / Self Care | Payer: BLUE CROSS/BLUE SHIELD | Attending: Emergency Medicine | Admitting: Emergency Medicine

## 2018-08-11 DIAGNOSIS — Y9389 Activity, other specified: Secondary | ICD-10-CM | POA: Diagnosis not present

## 2018-08-11 DIAGNOSIS — Y92410 Unspecified street and highway as the place of occurrence of the external cause: Secondary | ICD-10-CM | POA: Diagnosis not present

## 2018-08-11 DIAGNOSIS — Y998 Other external cause status: Secondary | ICD-10-CM | POA: Insufficient documentation

## 2018-08-11 DIAGNOSIS — S161XXA Strain of muscle, fascia and tendon at neck level, initial encounter: Secondary | ICD-10-CM | POA: Insufficient documentation

## 2018-08-11 DIAGNOSIS — Z79899 Other long term (current) drug therapy: Secondary | ICD-10-CM | POA: Insufficient documentation

## 2018-08-11 DIAGNOSIS — S199XXA Unspecified injury of neck, initial encounter: Secondary | ICD-10-CM | POA: Diagnosis not present

## 2018-08-11 MED ORDER — METHOCARBAMOL 500 MG PO TABS: 500.0000 mg | ORAL_TABLET | Freq: Two times a day (BID) | ORAL | 0 refills | Status: DC

## 2018-08-11 MED ORDER — ONDANSETRON 4 MG PO TBDP: 4.0000 mg | ORAL_TABLET | Freq: Three times a day (TID) | ORAL | 0 refills | Status: DC | PRN

## 2018-08-11 MED ORDER — LIDOCAINE 5 % EX PTCH: 1.0000 | MEDICATED_PATCH | CUTANEOUS | 0 refills | Status: DC
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
## 2018-08-13 ENCOUNTER — Telehealth: Payer: Self-pay | Admitting: *Deleted

## 2018-08-13 MED ORDER — FLUCONAZOLE 150 MG PO TABS: 150.0000 mg | ORAL_TABLET | Freq: Every day | ORAL | 0 refills | Status: DC

## 2018-08-13 NOTE — Telephone Encounter (Signed)
Patient called to update, has been doing well, no further bleeding taking estradiol and progesterone and doing well. She does note having a little white discharge/itch asked if cream or pills could be sent to pharmacy? Please advise

## 2018-08-13 NOTE — Telephone Encounter (Signed)
Patient informed, Rx sent.  

## 2018-08-13 NOTE — Telephone Encounter (Signed)
Okay for Diflucan 150 mg x 1 dose 

## 2018-09-17 DIAGNOSIS — H35033 Hypertensive retinopathy, bilateral: Secondary | ICD-10-CM | POA: Diagnosis not present

## 2018-10-02 DIAGNOSIS — R82998 Other abnormal findings in urine: Secondary | ICD-10-CM | POA: Diagnosis not present

## 2018-10-02 DIAGNOSIS — E7849 Other hyperlipidemia: Secondary | ICD-10-CM | POA: Diagnosis not present

## 2018-10-02 DIAGNOSIS — I10 Essential (primary) hypertension: Secondary | ICD-10-CM | POA: Diagnosis not present

## 2018-10-09 DIAGNOSIS — I10 Essential (primary) hypertension: Secondary | ICD-10-CM | POA: Diagnosis not present

## 2018-10-09 DIAGNOSIS — E7849 Other hyperlipidemia: Secondary | ICD-10-CM | POA: Diagnosis not present

## 2018-10-09 DIAGNOSIS — F0781 Postconcussional syndrome: Secondary | ICD-10-CM | POA: Diagnosis not present

## 2018-10-09 DIAGNOSIS — M797 Fibromyalgia: Secondary | ICD-10-CM | POA: Diagnosis not present

## 2018-10-09 DIAGNOSIS — Z0000 Encounter for general adult medical examination without abnormal findings: Secondary | ICD-10-CM | POA: Diagnosis not present

## 2018-10-09 DIAGNOSIS — Z1389 Encounter for screening for other disorder: Secondary | ICD-10-CM | POA: Diagnosis not present

## 2018-10-14 DIAGNOSIS — Z1212 Encounter for screening for malignant neoplasm of rectum: Secondary | ICD-10-CM | POA: Diagnosis not present

## 2018-11-11 DIAGNOSIS — J069 Acute upper respiratory infection, unspecified: Secondary | ICD-10-CM | POA: Diagnosis not present

## 2018-11-11 DIAGNOSIS — Z6828 Body mass index (BMI) 28.0-28.9, adult: Secondary | ICD-10-CM | POA: Diagnosis not present

## 2018-11-11 DIAGNOSIS — I10 Essential (primary) hypertension: Secondary | ICD-10-CM | POA: Diagnosis not present

## 2018-11-11 DIAGNOSIS — R52 Pain, unspecified: Secondary | ICD-10-CM | POA: Diagnosis not present

## 2019-02-16 ENCOUNTER — Encounter: Payer: Self-pay | Admitting: Gynecology

## 2019-02-16 ENCOUNTER — Other Ambulatory Visit: Payer: Self-pay

## 2019-02-16 ENCOUNTER — Ambulatory Visit: Payer: BLUE CROSS/BLUE SHIELD | Admitting: Gynecology

## 2019-02-16 VITALS — BP 116/70

## 2019-02-16 DIAGNOSIS — R350 Frequency of micturition: Secondary | ICD-10-CM

## 2019-02-16 DIAGNOSIS — N898 Other specified noninflammatory disorders of vagina: Secondary | ICD-10-CM | POA: Diagnosis not present

## 2019-02-16 DIAGNOSIS — Z79890 Hormone replacement therapy (postmenopausal): Secondary | ICD-10-CM

## 2019-02-16 LAB — WET PREP FOR TRICH, YEAST, CLUE

## 2019-02-16 MED ORDER — FLUCONAZOLE 150 MG PO TABS: 150.0000 mg | ORAL_TABLET | Freq: Every day | ORAL | 0 refills | Status: DC

## 2019-02-16 NOTE — Patient Instructions (Addendum)
Take the Diflucan pill daily for 2 days.  Follow-up if your vaginal symptoms persist.  Verify which you are taking as far as the progesterone.  Do what we talked about in the office and call if you have any issues or questions.

## 2019-02-16 NOTE — Progress Notes (Signed)
    Katie Shepard 06-30-1956 [id number]        63 y.o.  [id number] presents with several days of vaginal discharge and itching.  Used OTC Monistat 2 days ago but had a lot of burning with it.  Also having urinary frequency and urgency.  No dysuria, low back pain, fever or chills.  Past medical history,surgical history, problem list, medications, allergies, family history and social history were all reviewed and documented in the EPIC chart.  Directed ROS with pertinent positives and negatives documented in the history of present illness/assessment and plan.  Exam: [name] assistant Vitals:   02/16/19 1214  BP: 116/70   General appearance:  Normal Spine straight without CVA tenderness Abdomen soft nontender without masses guarding rebound Pelvic external BUS vagina with atrophic changes.  Slight white discharge noted.  Cervix normal.  Uterus grossly normal midline mobile nontender.  Adnexa without masses or tenderness.  Assessment/Plan:  63 y.o. [id number] with history and exam as above:  1. Vaginal discharge/urinary frequency/urgency.  Wet prep consistent with yeast.  Urine analysis shows few bacteria.  Will culture and treat if positive.  Will address the yeast with Diflucan 150 mg daily x2 days given initial failure with Monistat. 2. HRT.  Patient is on estradiol 1 mg and she believes Prometrium 100 mg 4 times weekly.  She has a history of irregular bleeding on HRT with a negative sonohysterogram and biopsy.  Has been on Prometrium daily with 100 mg then 200 mg, medroxyprogesterone and Megace but continued to bleed regardless.  She has had a hysteroscopy D&C with resection of endometrial polyps in the past.  The last visit 07/2018 she was to take the Prometrium 200 mg the first 12 days of each month but apparently now is taking it 4 times weekly and has had no bleeding since.  She believes it is the 100 mg tablets she is taking.  We discussed the need for progesterone for endometrial  protection.  Options to take no progesterone and survey the endometrium annually also discussed.  We reviewed that the way she is taking her progesterone is not conventional and the issue is to whether this is providing her with endometrial protection discussed.  She is going to verify that she is taking the 100 mg versus 200 mg.  If she is taking the 200 mg 4 times weekly then I think this probably will provide endometrial protection although again I discussed with her this is a less than conventional way of taking the progesterone and if she would continue with this she would call if she has any bleeding at all..  If it is the 100 mg she is going to increase this to 100 mg nightly and see how she does.  If she has any bleeding she will call.  If she does well with this she will follow-up in July when she is due for her annual exam.    Anastasio Auerbach MD, 12:21 PM 02/16/2019

## 2019-02-18 ENCOUNTER — Other Ambulatory Visit: Payer: Self-pay | Admitting: Gynecology

## 2019-02-18 LAB — URINALYSIS, COMPLETE W/RFL CULTURE
Bilirubin Urine: NEGATIVE
Glucose, UA: NEGATIVE
Hgb urine dipstick: NEGATIVE
Hyaline Cast: NONE SEEN /LPF
Ketones, ur: NEGATIVE
Nitrites, Initial: NEGATIVE
Protein, ur: NEGATIVE
RBC / HPF: NONE SEEN /HPF (ref 0–2)
Specific Gravity, Urine: 1.015 (ref 1.001–1.03)
pH: 5.5 (ref 5.0–8.0)

## 2019-02-18 LAB — URINE CULTURE
MICRO NUMBER:: 487026
SPECIMEN QUALITY:: ADEQUATE

## 2019-02-18 LAB — CULTURE INDICATED

## 2019-02-18 MED ORDER — AMPICILLIN 500 MG PO CAPS: 500.0000 mg | ORAL_CAPSULE | Freq: Four times a day (QID) | ORAL | 0 refills | Status: DC

## 2019-04-13 ENCOUNTER — Other Ambulatory Visit: Payer: Self-pay

## 2019-04-13 DIAGNOSIS — M797 Fibromyalgia: Secondary | ICD-10-CM | POA: Diagnosis not present

## 2019-04-13 DIAGNOSIS — I10 Essential (primary) hypertension: Secondary | ICD-10-CM | POA: Diagnosis not present

## 2019-04-13 DIAGNOSIS — E785 Hyperlipidemia, unspecified: Secondary | ICD-10-CM | POA: Diagnosis not present

## 2019-04-13 DIAGNOSIS — M542 Cervicalgia: Secondary | ICD-10-CM | POA: Diagnosis not present

## 2019-04-14 ENCOUNTER — Encounter: Payer: Self-pay | Admitting: Gynecology

## 2019-04-14 ENCOUNTER — Ambulatory Visit: Payer: BC Managed Care – PPO | Admitting: Gynecology

## 2019-04-14 VITALS — BP 120/76 | Ht 65.0 in | Wt 174.0 lb

## 2019-04-14 DIAGNOSIS — N952 Postmenopausal atrophic vaginitis: Secondary | ICD-10-CM | POA: Diagnosis not present

## 2019-04-14 DIAGNOSIS — Z01419 Encounter for gynecological examination (general) (routine) without abnormal findings: Secondary | ICD-10-CM

## 2019-04-14 DIAGNOSIS — Z79890 Hormone replacement therapy (postmenopausal): Secondary | ICD-10-CM

## 2019-04-14 MED ORDER — PROGESTERONE MICRONIZED 100 MG PO CAPS: 100.0000 mg | ORAL_CAPSULE | Freq: Every day | ORAL | 4 refills | Status: DC

## 2019-04-14 MED ORDER — ESTRADIOL 1 MG PO TABS: 1.0000 mg | ORAL_TABLET | Freq: Every day | ORAL | 4 refills | Status: DC

## 2019-04-14 NOTE — Progress Notes (Signed)
    Katie Shepard 1956/01/18 [id number]        63 y.o.  [id number] for annual gynecologic exam.  Continues with occasional postmenopausal spotting  Past medical history,surgical history, problem list, medications, allergies, family history and social history were all reviewed and documented as reviewed in the EPIC chart.  ROS:  Performed with pertinent positives and negatives included in the history, assessment and plan.   Additional significant findings : None   Exam: [name] assistant Vitals:   04/14/19 1356  BP: 120/76  Weight: 174 lb (78.9 kg)  Height: 5\' 5"  (1.651 m)   Body mass index is 28.96 kg/m.  General appearance:  Normal affect, orientation and appearance. Skin: Grossly normal HEENT: Without gross lesions.  No cervical or supraclavicular adenopathy. Thyroid normal.  Lungs:  Clear without wheezing, rales or rhonchi Cardiac: RR, without RMG Abdominal:  Soft, nontender, without masses, guarding, rebound, organomegaly or hernia Breasts:  Examined lying and sitting without masses, retractions, discharge or axillary adenopathy. Pelvic:  Ext, BUS, Vagina: Normal with atrophic changes  Cervix: Normal with atrophic changes  Uterus: Anteverted, normal size, shape and contour, midline and mobile nontender   Adnexa: Without masses or tenderness    Anus and perineum: Normal   Rectovaginal: Normal sphincter tone without palpated masses or tenderness.    Assessment/Plan:  63 y.o. [id number] female for annual gynecologic exam.   1. Postmenopausal/HRT.  Continues on estradiol 1 mg and Prometrium 100 mg taken every other day.  Has been on a variety of different regimens to include 200 mg of Prometrium daily and medroxyprogesterone 2.5 mg daily.  Had hysteroscopy D&C with resection of endometrial polyps in 2018.  Had sonohysterogram 07/2018 which was negative with negative endometrial biopsy showing proliferative endometrium.  Options for management discussed to include switching to  whole different regimen such as Activella, intermittent monthly withdrawals or continuing on the regiment that she is taking now with occasional spotting which is tolerable to her.  At this point the patient prefers to stay on her regiment.  If she would continue to have irregular bleeding at some point we will need to decide when to relook at the endometrium and/or switch regimens.  We discussed the risks versus benefits of HRT multiple times at this point the patient wants to continue due to menopausal symptoms. 2. Pap smear/HPV 2016.  Pap smear 2019 normal.  No Pap smear done today.  No history of significant abnormal Pap smears.  Plan repeat Pap smear at 3-year interval from her last 2019 Pap smear. 3. Mammography due now and patient is going to call and schedule.  Breast exam normal today. 4. Colonoscopy due now and she is going to call and schedule. 5. DEXA 2019 normal.  Recommend follow-up DEXA at age 65. 6. Health maintenance.  No routine lab work done as patient does this elsewhere.  Follow-up 1 year, sooner as needed.   Anastasio Auerbach MD, 2:28 PM 04/14/2019

## 2019-04-14 NOTE — Patient Instructions (Signed)
Call if significant bleeding.  Schedule your mammogram and colonoscopy.  Follow-up in 1 year for annual exam

## 2019-06-04 DIAGNOSIS — J019 Acute sinusitis, unspecified: Secondary | ICD-10-CM | POA: Diagnosis not present

## 2019-06-30 ENCOUNTER — Encounter: Payer: Self-pay | Admitting: Gynecology

## 2019-07-01 ENCOUNTER — Other Ambulatory Visit: Payer: Self-pay | Admitting: Gynecology

## 2019-09-07 ENCOUNTER — Other Ambulatory Visit: Payer: Self-pay

## 2019-09-09 ENCOUNTER — Other Ambulatory Visit: Payer: Self-pay

## 2019-09-09 ENCOUNTER — Ambulatory Visit: Payer: BC Managed Care – PPO | Admitting: Gynecology

## 2019-09-09 ENCOUNTER — Encounter: Payer: Self-pay | Admitting: Gynecology

## 2019-09-09 VITALS — BP 122/76

## 2019-09-09 DIAGNOSIS — Z79890 Hormone replacement therapy (postmenopausal): Secondary | ICD-10-CM | POA: Diagnosis not present

## 2019-09-09 DIAGNOSIS — N926 Irregular menstruation, unspecified: Secondary | ICD-10-CM | POA: Diagnosis not present

## 2019-09-09 DIAGNOSIS — D259 Leiomyoma of uterus, unspecified: Secondary | ICD-10-CM

## 2019-09-09 MED ORDER — MEGESTROL ACETATE 20 MG PO TABS: 20.0000 mg | ORAL_TABLET | Freq: Every day | ORAL | 3 refills | Status: DC

## 2019-09-09 MED ORDER — ALPRAZOLAM 0.5 MG PO TABS: 0.5000 mg | ORAL_TABLET | Freq: Every evening | ORAL | 1 refills | Status: DC | PRN

## 2019-09-09 NOTE — Patient Instructions (Signed)
Start on the Megace 20 mg tablet daily along with your estrogen.  Follow-up for the ultrasound as scheduled in January

## 2019-09-09 NOTE — Progress Notes (Signed)
    Katie Shepard 1956-09-30 [id number]        63 y.o.  [id number] presents on HRT to consist estradiol 1 mg and Prometrium 100 mg nightly.  Has been having bleeding on and off since September.  Anywhere from spotting to heavy flows.  She has a history of irregular bleeding on HRT over the past 2 years.  Has had numerous evaluations to include endometrial biopsies 2016 showing atrophy, 2018 showing disordered proliferative endometrium.  Follow-up hysteroscopy D&C which showed degenerating secretory endometrium 05/2017 and sonohysterogram 07/2018 which showed no intracavitary abnormalities, several small myomas and biopsy showing disordered proliferative endometrium.  She has been on various combinations of HRT to include Prometrium at 200 mg and now at 100 mg.  Past medical history,surgical history, problem list, medications, allergies, family history and social history were all reviewed and documented in the EPIC chart.  Directed ROS with pertinent positives and negatives documented in the history of present illness/assessment and plan.  Exam: [name] assistant Vitals:   09/09/19 1530  BP: 122/76   General appearance:  Normal Abdomen soft nontender without masses guarding rebound Pelvic external BUS vagina with menses like flow.  Cervix normal.  Uterus generous, midline mobile nontender.  Adnexa without masses or tenderness.  Assessment/Plan:  63 y.o. [id number] with persistent irregular bleeding over the last 2 years on HRT.  Several different regimen changes.  Last endometrial assessment approximately a year ago with negative biopsy.  Options for ongoing management were reviewed to include changing HRT, endometrial ablation, Mirena IUD for endometrial suppression and hysterectomy.  The question is when to reevaluate the endometrium as to not to miss significant pathology although she has been assessed multiple times in the last 2 years.  Will acutely control bleeding with changing Prometrium to  Megace 20 mg daily along with her estradiol 1 mg.  Patient wants to think of her options.  We will schedule ultrasound to relook at her uterus in January with follow-up appointment with Dr Dellis Filbert as the patient understands that I am retiring.  She will think of her options in the interim.   Anastasio Auerbach MD, 3:58 PM 09/09/2019

## 2019-09-10 ENCOUNTER — Ambulatory Visit: Payer: BC Managed Care – PPO | Admitting: Gynecology

## 2019-10-01 ENCOUNTER — Telehealth: Payer: Self-pay | Admitting: *Deleted

## 2019-10-01 MED ORDER — NORETHINDRONE ACETATE 5 MG PO TABS: 5.0000 mg | ORAL_TABLET | Freq: Every day | ORAL | 1 refills | Status: DC

## 2019-10-01 NOTE — Telephone Encounter (Signed)
Patient called was prescribed megace 20 mg tablet to help with irregular bleeding at office visit on 09/09/19, patient said bleeding did stop while taking megace,but she developed hot flashes, sweats and migraines. So she stopped megace, migraines have stopped, mild hot flashes. Bleeding has returned now, changing pad every 3-4 hours, only taking estradiol 1 mg tablet , not taking progesterone reports you told her 1 time before to stop progesterone if bleeding should occur. Patient said you told her she could try a pill similar to birth control pill, asked if this could be done until ultrasound scheduled on 10/22/19? Please advise

## 2019-10-01 NOTE — Telephone Encounter (Signed)
Recommend adding Aygestin 5 mg daily to the estradiol and see if that does not help with the bleeding.  #30 with 1 refill

## 2019-10-01 NOTE — Telephone Encounter (Signed)
patient informed with below, Rx sent.

## 2019-10-06 DIAGNOSIS — Z8639 Personal history of other endocrine, nutritional and metabolic disease: Secondary | ICD-10-CM | POA: Diagnosis not present

## 2019-10-06 DIAGNOSIS — E663 Overweight: Secondary | ICD-10-CM | POA: Diagnosis not present

## 2019-10-06 DIAGNOSIS — E7849 Other hyperlipidemia: Secondary | ICD-10-CM | POA: Diagnosis not present

## 2019-10-06 DIAGNOSIS — I10 Essential (primary) hypertension: Secondary | ICD-10-CM | POA: Diagnosis not present

## 2019-10-09 ENCOUNTER — Telehealth: Payer: Self-pay | Admitting: *Deleted

## 2019-10-09 MED ORDER — MEGESTROL ACETATE 40 MG PO TABS: 40.0000 mg | ORAL_TABLET | Freq: Two times a day (BID) | ORAL | 0 refills | Status: DC

## 2019-10-09 NOTE — Telephone Encounter (Signed)
Patient aware Rx sent.  

## 2019-10-09 NOTE — Telephone Encounter (Signed)
Stop Estrogen.  Start Megace 40 mg BID until Pelvic US visit.

## 2019-10-09 NOTE — Telephone Encounter (Signed)
Dr.Fontaine patient ) patient called history of irregular bleeding bleedstarted 10/01/19  c/o bleeding heavy x 2 days, changing tampon every 1 hour this am, with painful cramping. Dr. Phineas Real prescribed Aygestin 5 mg tablet on 10/01/19 1 po daily, but patient stopped Rx 3 days ago reports it caused stomach pains. She is taking estradiol 1 mg tablet daily, has been off progesterone. Has pelvic ultrasound scheduled on 10/22/19. Patient reports megace 20 mg helped with the bleeding, but did cause hot flashes, sweats and migraines. But she is willing to take megace if this will help the bleeding.  Please advise

## 2019-10-14 DIAGNOSIS — E785 Hyperlipidemia, unspecified: Secondary | ICD-10-CM | POA: Diagnosis not present

## 2019-10-14 DIAGNOSIS — M797 Fibromyalgia: Secondary | ICD-10-CM | POA: Diagnosis not present

## 2019-10-14 DIAGNOSIS — Z0000 Encounter for general adult medical examination without abnormal findings: Secondary | ICD-10-CM | POA: Diagnosis not present

## 2019-10-14 DIAGNOSIS — Z1331 Encounter for screening for depression: Secondary | ICD-10-CM | POA: Diagnosis not present

## 2019-10-14 DIAGNOSIS — I10 Essential (primary) hypertension: Secondary | ICD-10-CM | POA: Diagnosis not present

## 2019-10-21 ENCOUNTER — Other Ambulatory Visit: Payer: Self-pay

## 2019-10-22 ENCOUNTER — Ambulatory Visit (INDEPENDENT_AMBULATORY_CARE_PROVIDER_SITE_OTHER): Payer: BC Managed Care – PPO

## 2019-10-22 ENCOUNTER — Ambulatory Visit: Payer: BC Managed Care – PPO | Admitting: Obstetrics & Gynecology

## 2019-10-22 DIAGNOSIS — N950 Postmenopausal bleeding: Secondary | ICD-10-CM | POA: Diagnosis not present

## 2019-10-22 DIAGNOSIS — Z79890 Hormone replacement therapy (postmenopausal): Secondary | ICD-10-CM

## 2019-10-22 DIAGNOSIS — D219 Benign neoplasm of connective and other soft tissue, unspecified: Secondary | ICD-10-CM | POA: Diagnosis not present

## 2019-10-22 DIAGNOSIS — D259 Leiomyoma of uterus, unspecified: Secondary | ICD-10-CM

## 2019-10-22 DIAGNOSIS — N926 Irregular menstruation, unspecified: Secondary | ICD-10-CM | POA: Diagnosis not present

## 2019-10-22 NOTE — Progress Notes (Signed)
    Katie Shepard 1956-02-18 [id number]        64 y.o.  [id number]   RP: PMB on HRT for Pelvic US  HPI: Patient was on HRT x >10 yrs.  PMB on-off x the past 2 yrs.  Previous investigations negative.  D/Ced HRT and on Megace only x 09/2019.  Well tolerated currently.   OB History  Gravida Para Term Preterm AB Living  5 3     2 3   SAB TAB Ectopic Multiple Live Births  1 1          # Outcome Date GA Lbr Len/2nd Weight Sex Delivery Anes PTL Lv  5 SAB           4 TAB           3 Para           2 Para           1 Para             Past medical history,surgical history, problem list, medications, allergies, family history and social history were all reviewed and documented in the EPIC chart.   Directed ROS with pertinent positives and negatives documented in the history of present illness/assessment and plan.  Exam:  There were no vitals filed for this visit. General appearance:  Normal  Pelvic US today: T/V images.  Comparison is made with previous scan October 2019.  Anteverted slightly enlarged uterus with several small intramural fibroids.  The uterus is measured at 11.0 x 8.37 x 6.75 cm.  The largest fibroid is measured at 1.9 cm.  The endometrial lining is thin and symmetrical measured at 3.06 mm (Thinner than previous scan).  No mass, thickening, or abnormal blood flow seen.  Both ovaries are small with atrophic appearance.  No adnexal mass.  No free fluid in the posterior cul-de-sac.   Assessment/Plan:  64 y.o. [id number]   1. Postmenopausal bleeding Postmenopausal bleeding on hormone replacement therapy for more than 10 years.  Patient has stopped her hormone replacement therapy and is doing well with out.  Was started on Megace to control the postmenopausal bleeding.  Pelvic ultrasound findings thoroughly reviewed with patient, uterus is slightly enlarged with several small intramural fibroids.  The endometrial lining is thin and symmetrical at 3.06 mm, which is thinner than on  previous scan.  No mass, thickening or abnormal flow seen at the level of the endometrium.  Both ovaries are small and atrophic.  Patient reassured.  Patient will stay off of hormone replacement therapy.  Can stop Megace.  2. Hormone replacement therapy (HRT) Decision not to restart HRT.  Can stop Megace.  Will observe off of HRT.  3. Fibroids Small intramural fibroids.  Will probably shrink further off of hormone replacement therapy.  Patient reassured, will observe.  Princess Bruins MD, 3:09 PM 10/22/2019

## 2019-10-25 ENCOUNTER — Encounter: Payer: Self-pay | Admitting: Obstetrics & Gynecology

## 2019-10-25 NOTE — Patient Instructions (Signed)
1. Postmenopausal bleeding Postmenopausal bleeding on hormone replacement therapy for more than 10 years.  Patient has stopped her hormone replacement therapy and is doing well with out.  Was started on Megace to control the postmenopausal bleeding.  Pelvic ultrasound findings thoroughly reviewed with patient, uterus is slightly enlarged with several small intramural fibroids.  The endometrial lining is thin and symmetrical at 3.06 mm, which is thinner than on previous scan.  No mass, thickening or abnormal flow seen at the level of the endometrium.  Both ovaries are small and atrophic.  Patient reassured.  Patient will stay off of hormone replacement therapy.  Can stop Megace.  2. Hormone replacement therapy (HRT) Decision not to restart HRT.  Can stop Megace.  Will observe off of HRT.  3. Fibroids Small intramural fibroids.  Will probably shrink further off of hormone replacement therapy.  Patient reassured, will observe.  Katie Shepard, it was a pleasure seeing you today!

## 2019-11-03 NOTE — Progress Notes (Signed)
Looks like this imaging was reviewed with the patient at her recent visit with Dr. Dellis Filbert.

## 2019-11-13 ENCOUNTER — Encounter: Payer: Self-pay | Admitting: Cardiovascular Disease

## 2019-11-13 ENCOUNTER — Ambulatory Visit: Payer: BC Managed Care – PPO | Admitting: Cardiovascular Disease

## 2019-11-13 ENCOUNTER — Other Ambulatory Visit: Payer: Self-pay

## 2019-11-13 VITALS — BP 120/84 | HR 64 | Ht 65.0 in | Wt 182.0 lb

## 2019-11-13 DIAGNOSIS — I10 Essential (primary) hypertension: Secondary | ICD-10-CM | POA: Diagnosis not present

## 2019-11-13 DIAGNOSIS — E782 Mixed hyperlipidemia: Secondary | ICD-10-CM

## 2019-11-13 MED ORDER — ATENOLOL 50 MG PO TABS: 50.0000 mg | ORAL_TABLET | Freq: Every day | ORAL | 3 refills | Status: DC

## 2019-11-13 NOTE — Progress Notes (Signed)
Cardiology Office Note:    Date:  11/13/2019   ID:  Katie Shepard, DOB October 09, 1955, MRN [id number]  PCP:  Burnard Bunting, MD  Cardiologist:  Sherren Mocha, MD  Electrophysiologist:  None   Referring MD: Burnard Bunting, MD   Chief Complaint  Patient presents with  . Hypertension    History of Present Illness:    Katie Shepard is a 64 y.o. female with a hx of chest pain, presenting for follow-up evaluation.  She was last seen in 2018 when she was evaluated for atypical chest pain.  At that time she underwent echo and nuclear scans that demonstrated reassuring findings without any significant ischemia and normal LV function was noted.  The patient is here alone today.  Overall she is doing fairly well.  She has had a lot of stress in her life over the last year.  Her mother passed away in 09-03-23.  She has an elderly father who is also followed in my practice.  She has occasional chest pain associated with stress.  She does not have any exertional chest pain or pressure.  She denies orthopnea, PND, or heart palpitations.  She has noticed that her blood pressure has been increased since she stopped hormone replacement therapy.  She had to discontinue this because of abnormal vaginal bleeding.  For a period of time she took a double dose of atenolol and her blood pressure was in a good range with this.  She is back to 1 dose daily because she was concerned she would run out of the medication.  With this dose, her blood pressure averages from 140 to 99991111 mmHg systolic. Past Medical History:  Diagnosis Date  . Arthritis   . Endometrial polyp   . Fibromyalgia    per pt has not been dx by doctor  . GERD (gastroesophageal reflux disease)   . Hyperlipidemia   . Leiomyoma    Multiple small on ultrasound  . Migraine   . PMB (postmenopausal bleeding)   . Wears glasses     Past Surgical History:  Procedure Laterality Date  . CARDIOVASCULAR STRESS TEST  05/17/2017   dr Araiyah Cumpton   Low  risk nuclear study w/ chest pain and positive ECG changes noted at peak exertion; mild apical ishemia ; reversible small defect of mild severity present in the apical anterior and apex location;  nuclear stress ef 75% , normal wall motion  . DILATATION & CURETTAGE/HYSTEROSCOPY WITH MYOSURE N/A 05/29/2017   Procedure: DILATATION & CURETTAGE/HYSTEROSCOPY WITH MYOSURE;  Surgeon: Anastasio Auerbach, MD;  Location: [facility];  Service: Gynecology;  Laterality: N/A;  . ORIF RIGHT ANKLE FRACTURE  1992   retained hardware  . TONSILLECTOMY AND ADENOIDECTOMY  age 77  . TRANSTHORACIC ECHOCARDIOGRAM  05/17/2017   dr Nickolus Wadding   ef 55-60%    Current Medications: Current Meds  Medication Sig  . ALPRAZolam (XANAX) 0.5 MG tablet Take 1 tablet (0.5 mg total) by mouth at bedtime as needed for anxiety.  Marland Kitchen atenolol (TENORMIN) 50 MG tablet Take 1 tablet (50 mg total) by mouth daily.  Marland Kitchen atorvastatin (LIPITOR) 20 MG tablet Take 20 mg by mouth every evening.   . Cholecalciferol (VITAMIN D3) 2000 units TABS Take 1 tablet by mouth daily.  . Coenzyme Q10 (COQ-10) 100 MG CAPS Take 1 capsule by mouth daily.  Marland Kitchen FLUoxetine (PROZAC) 10 MG capsule Take 10 mg by mouth every evening.   Marland Kitchen HYDROcodone-acetaminophen (NORCO/VICODIN) 5-325 MG tablet Take 1-2 tablets by mouth  daily as needed for migraine.  . lidocaine (LIDODERM) 5 % Place 1 patch onto the skin daily. Remove & Discard patch within 12 hours or as directed by MD  . loratadine (CLARITIN) 10 MG tablet Take 10 mg by mouth daily as needed for allergies.  . multivitamin-lutein (OCUVITE-LUTEIN) CAPS capsule Take 1 capsule by mouth daily.  Marland Kitchen zolmitriptan (ZOMIG) 5 MG nasal solution Place 1 spray into the nose daily as needed for migraine.   . [DISCONTINUED] atenolol (TENORMIN) 25 MG tablet Take 25 mg by mouth every evening.      Allergies:   Patient has no known allergies.   Social History   Socioeconomic History  . Marital status: Married    Spouse  name: Not on file  . Number of children: Not on file  . Years of education: Not on file  . Highest education level: Not on file  Occupational History  . Not on file  Tobacco Use  . Smoking status: Never Smoker  . Smokeless tobacco: Never Used  Substance and Sexual Activity  . Alcohol use: Yes    Alcohol/week: 0.0 standard drinks    Comment: occasional  . Drug use: No  . Sexual activity: Yes    Birth control/protection: Post-menopausal    Comment: 1st intercourse 64 yo-Fewer than 5 partners  Other Topics Concern  . Not on file  Social History Narrative  . Not on file   Social Determinants of Health   Financial Resource Strain:   . Difficulty of Paying Living Expenses: Not on file  Food Insecurity:   . Worried About Charity fundraiser in the Last Year: Not on file  . Ran Out of Food in the Last Year: Not on file  Transportation Needs:   . Lack of Transportation (Medical): Not on file  . Lack of Transportation (Non-Medical): Not on file  Physical Activity:   . Days of Exercise per Week: Not on file  . Minutes of Exercise per Session: Not on file  Stress:   . Feeling of Stress : Not on file  Social Connections:   . Frequency of Communication with Friends and Family: Not on file  . Frequency of Social Gatherings with Friends and Family: Not on file  . Attends Religious Services: Not on file  . Active Member of Clubs or Organizations: Not on file  . Attends Archivist Meetings: Not on file  . Marital Status: Not on file     Family History: The patient's family history includes Aortic stenosis in her father; Heart disease in her father; Hypertension in her father; Obesity in her sister; Stroke in her mother.  ROS:   Please see the history of present illness.    All other systems reviewed and are negative.  EKGs/Labs/Other Studies Reviewed:    The following studies were reviewed today: Echocardiogram 05/17/2017: Study Conclusions   - Left ventricle: The  cavity size was normal. Wall thickness was  normal. Systolic function was normal. The estimated ejection  fraction was in the range of 55% to 60%. Wall motion was normal;  there were no regional wall motion abnormalities.   Impressions:   - Definity used; normal LV systolic function; probable mild  diastolic dysfunction.   Myoview stress test 05/17/2017: Study Highlights    Nuclear stress EF: 75%.  Blood pressure demonstrated a normal response to exercise.  ST segment depression of 2 mm was noted during stress in the V5 and V6 leads, and returning to baseline after less  than 1 minute of recovery.  Defect 1: There is a small defect of mild severity present in the apical anterior and apex location.   Low risk stress nuclear study with chest pain and positive ECG changes noted at peak exertion; mild apical ischemia; EF 75 with normal wall motion.    EKG:  EKG is ordered today.  The ekg ordered today demonstrates normal sinus rhythm 64 bpm, within normal limits.  Recent Labs: No results found for requested labs within last 8760 hours.  Recent Lipid Panel    Component Value Date/Time   CHOL 282 (H) 03/27/2016 0947   TRIG 466 (H) 03/27/2016 0947   HDL 37 (L) 03/27/2016 0947   CHOLHDL 7.6 (H) 03/27/2016 0947   VLDL NOT CALC 03/27/2016 0947   LDLCALC NOT CALC 03/27/2016 0947    Physical Exam:    VS:  BP 120/84   Pulse 64   Ht 5\' 5"  (1.651 m)   Wt 182 lb (82.6 kg)   LMP 03/16/2017   SpO2 99%   BMI 30.29 kg/m     Wt Readings from Last 3 Encounters:  11/13/19 182 lb (82.6 kg)  04/14/19 174 lb (78.9 kg)  08/11/18 160 lb (72.6 kg)     GEN:  Well nourished, well developed in no acute distress HEENT: Normal NECK: No JVD; No carotid bruits LYMPHATICS: No lymphadenopathy CARDIAC: RRR, no murmurs, rubs, gallops RESPIRATORY:  Clear to auscultation without rales, wheezing or rhonchi  ABDOMEN: Soft, non-tender, non-distended MUSCULOSKELETAL:  No edema; No deformity   SKIN: Warm and dry NEUROLOGIC:  Alert and oriented x 3 PSYCHIATRIC:  Normal affect   ASSESSMENT:    1. Essential hypertension   2. Mixed hyperlipidemia    PLAN:    In order of problems listed above:  1. I have recommended that she increase atenolol to 50 mg daily.  This dose seemed to be effective for her based on my review of her home readings.  She tolerated it well.  I have reviewed her most recent labs which include a creatinine of 0.8 mg/dL and a potassium of 4.4. 2. Followed by her primary care physician.  Treated with atorvastatin 20 mg daily.  LDL cholesterol was 80 mg/dL on recent labs.  Transaminases reviewed and ALT is 9.  Lifestyle modification discussed.   Medication Adjustments/Labs and Tests Ordered: Current medicines are reviewed at length with the patient today.  Concerns regarding medicines are outlined above.  Orders Placed This Encounter  Procedures  . EKG 12-Lead   Meds ordered this encounter  Medications  . atenolol (TENORMIN) 50 MG tablet    Sig: Take 1 tablet (50 mg total) by mouth daily.    Dispense:  90 tablet    Refill:  3    Patient Instructions  Medication Instructions:  1) INCREASE ATENOLOL to 50 mg daily *If you need a refill on your cardiac medications before your next appointment, please call your pharmacy*  Follow-Up: At [facility], you and your health needs are our priority.  As part of our continuing mission to provide you with exceptional heart care, we have created designated Provider Care Teams.  These Care Teams include your primary Cardiologist (physician) and Advanced Practice Providers (APPs -  Physician Assistants and Nurse Practitioners) who all work together to provide you with the care you need, when you need it.  Your next appointment:   2 year(s)  The format for your next appointment:   In Person  Provider:   You may  see Dr. Sherren Mocha or one of the following Advanced Practice Providers on your designated Care  Team:    Richardson Dopp, PA-C  [place], [place]  Daune Perch, [place]    Signed, Sherren Mocha, MD  11/13/2019 11:00 AM    [place]

## 2019-11-13 NOTE — Patient Instructions (Signed)
Medication Instructions:  1) INCREASE ATENOLOL to 50 mg daily *If you need a refill on your cardiac medications before your next appointment, please call your pharmacy*  Follow-Up: At [facility], you and your health needs are our priority.  As part of our continuing mission to provide you with exceptional heart care, we have created designated Provider Care Teams.  These Care Teams include your primary Cardiologist (physician) and Advanced Practice Providers (APPs -  Physician Assistants and Nurse Practitioners) who all work together to provide you with the care you need, when you need it.  Your next appointment:   2 year(s)  The format for your next appointment:   In Person  Provider:   You may see Dr. Sherren Mocha or one of the following Advanced Practice Providers on your designated Care Team:    Richardson Dopp, PA-C  [place], [place]  Daune Perch, [place]

## 2019-12-04 DIAGNOSIS — K2900 Acute gastritis without bleeding: Secondary | ICD-10-CM | POA: Diagnosis not present

## 2019-12-04 DIAGNOSIS — R1013 Epigastric pain: Secondary | ICD-10-CM | POA: Diagnosis not present

## 2019-12-04 DIAGNOSIS — I10 Essential (primary) hypertension: Secondary | ICD-10-CM | POA: Diagnosis not present

## 2019-12-04 DIAGNOSIS — G43009 Migraine without aura, not intractable, without status migrainosus: Secondary | ICD-10-CM | POA: Diagnosis not present

## 2019-12-10 DIAGNOSIS — H25813 Combined forms of age-related cataract, bilateral: Secondary | ICD-10-CM | POA: Diagnosis not present

## 2020-02-15 ENCOUNTER — Ambulatory Visit: Payer: BLUE CROSS/BLUE SHIELD | Admitting: Cardiovascular Disease

## 2020-02-17 DIAGNOSIS — Z1211 Encounter for screening for malignant neoplasm of colon: Secondary | ICD-10-CM | POA: Diagnosis not present

## 2020-02-17 DIAGNOSIS — K219 Gastro-esophageal reflux disease without esophagitis: Secondary | ICD-10-CM | POA: Diagnosis not present

## 2020-02-17 DIAGNOSIS — Z8371 Family history of colonic polyps: Secondary | ICD-10-CM | POA: Diagnosis not present

## 2020-02-17 DIAGNOSIS — R1013 Epigastric pain: Secondary | ICD-10-CM | POA: Diagnosis not present

## 2020-02-24 DIAGNOSIS — Z8371 Family history of colonic polyps: Secondary | ICD-10-CM | POA: Diagnosis not present

## 2020-02-24 DIAGNOSIS — Z01812 Encounter for preprocedural laboratory examination: Secondary | ICD-10-CM | POA: Diagnosis not present

## 2020-02-24 DIAGNOSIS — K219 Gastro-esophageal reflux disease without esophagitis: Secondary | ICD-10-CM | POA: Diagnosis not present

## 2020-02-24 DIAGNOSIS — Z1211 Encounter for screening for malignant neoplasm of colon: Secondary | ICD-10-CM | POA: Diagnosis not present

## 2020-02-24 DIAGNOSIS — Z20822 Contact with and (suspected) exposure to covid-19: Secondary | ICD-10-CM | POA: Diagnosis not present

## 2020-02-24 DIAGNOSIS — R1013 Epigastric pain: Secondary | ICD-10-CM | POA: Diagnosis not present

## 2020-03-02 DIAGNOSIS — Z8371 Family history of colonic polyps: Secondary | ICD-10-CM | POA: Diagnosis not present

## 2020-03-02 DIAGNOSIS — K2950 Unspecified chronic gastritis without bleeding: Secondary | ICD-10-CM | POA: Diagnosis not present

## 2020-03-02 DIAGNOSIS — D131 Benign neoplasm of stomach: Secondary | ICD-10-CM | POA: Diagnosis not present

## 2020-03-02 DIAGNOSIS — K2980 Duodenitis without bleeding: Secondary | ICD-10-CM | POA: Diagnosis not present

## 2020-03-02 DIAGNOSIS — K5730 Diverticulosis of large intestine without perforation or abscess without bleeding: Secondary | ICD-10-CM | POA: Diagnosis not present

## 2020-03-02 DIAGNOSIS — Z1211 Encounter for screening for malignant neoplasm of colon: Secondary | ICD-10-CM | POA: Diagnosis not present

## 2020-03-02 DIAGNOSIS — Z8719 Personal history of other diseases of the digestive system: Secondary | ICD-10-CM | POA: Diagnosis not present

## 2020-03-02 DIAGNOSIS — Z86010 Personal history of colonic polyps: Secondary | ICD-10-CM | POA: Diagnosis not present

## 2020-03-02 DIAGNOSIS — K2970 Gastritis, unspecified, without bleeding: Secondary | ICD-10-CM | POA: Diagnosis not present

## 2020-03-02 DIAGNOSIS — K2990 Gastroduodenitis, unspecified, without bleeding: Secondary | ICD-10-CM | POA: Diagnosis not present

## 2020-03-02 DIAGNOSIS — R1013 Epigastric pain: Secondary | ICD-10-CM | POA: Diagnosis not present

## 2020-03-02 DIAGNOSIS — K640 First degree hemorrhoids: Secondary | ICD-10-CM | POA: Diagnosis not present

## 2020-03-02 DIAGNOSIS — K219 Gastro-esophageal reflux disease without esophagitis: Secondary | ICD-10-CM | POA: Diagnosis not present

## 2020-03-23 DIAGNOSIS — R1013 Epigastric pain: Secondary | ICD-10-CM | POA: Diagnosis not present

## 2020-04-14 ENCOUNTER — Other Ambulatory Visit: Payer: Self-pay

## 2020-04-14 ENCOUNTER — Encounter: Payer: Self-pay | Admitting: Obstetrics & Gynecology

## 2020-04-14 ENCOUNTER — Ambulatory Visit: Payer: BC Managed Care – PPO | Admitting: Obstetrics & Gynecology

## 2020-04-14 VITALS — BP 122/80 | Ht 65.0 in | Wt 175.0 lb

## 2020-04-14 DIAGNOSIS — E663 Overweight: Secondary | ICD-10-CM | POA: Diagnosis not present

## 2020-04-14 DIAGNOSIS — Z780 Asymptomatic menopausal state: Secondary | ICD-10-CM

## 2020-04-14 DIAGNOSIS — Z01419 Encounter for gynecological examination (general) (routine) without abnormal findings: Secondary | ICD-10-CM

## 2020-04-14 NOTE — Progress Notes (Signed)
Katie Shepard 06-18-1956 [id number]   History:    64 y.o. [id number]  Married.  Has grand-children.  RP: Established patient for Annual Gynecologic exam  HPI: Patient was on HRT x >10 yrs.  PMB on-off x the past 2 yrs.  D/C HRT 09/2019.  Pelvic US 10/2019 Thin Endometrial line at 3.06 mm.  Well on no HRT since then.  No PMB anymore.  No pelvic pain.  Sexually active with husband, no pain with IC.  Urine/BMs normal.  Breasts normal.  BMI 29.12.  Active at work.  Health Labs with [place] Negative 2021.   Past medical history,surgical history, family history and social history were all reviewed and documented in the EPIC chart.  Gynecologic History Patient's last menstrual period was 03/16/2017.  Obstetric History OB History  Gravida Para Term Preterm AB Living  5 3     2 3   SAB TAB Ectopic Multiple Live Births  1 1          # Outcome Date GA Lbr Len/2nd Weight Sex Delivery Anes PTL Lv  5 SAB           4 TAB           3 Para           2 Para           1 Para              ROS: A ROS was performed and pertinent positives and negatives are included in the history.  GENERAL: No fevers or chills. HEENT: No change in vision, no earache, sore throat or sinus congestion. NECK: No pain or stiffness. CARDIOVASCULAR: No chest pain or pressure. No palpitations. PULMONARY: No shortness of breath, cough or wheeze. GASTROINTESTINAL: No abdominal pain, nausea, vomiting or diarrhea, melena or bright red blood per rectum. GENITOURINARY: No urinary frequency, urgency, hesitancy or dysuria. MUSCULOSKELETAL: No joint or muscle pain, no back pain, no recent trauma. DERMATOLOGIC: No rash, no itching, no lesions. ENDOCRINE: No polyuria, polydipsia, no heat or cold intolerance. No recent change in weight. HEMATOLOGICAL: No anemia or easy bruising or bleeding. NEUROLOGIC: No headache, seizures, numbness, tingling or weakness. PSYCHIATRIC: No depression, no loss of interest in normal activity or  change in sleep pattern.     Exam:   BP 122/80 (BP Location: Right Arm, Patient Position: Sitting, Cuff Size: Normal)   Ht 5\' 5"  (1.651 m)   Wt 175 lb (79.4 kg)   LMP 03/16/2017   BMI 29.12 kg/m   Body mass index is 29.12 kg/m.  General appearance : Well developed well nourished female. No acute distress HEENT: Eyes: no retinal hemorrhage or exudates,  Neck supple, trachea midline, no carotid bruits, no thyroidmegaly Lungs: Clear to auscultation, no rhonchi or wheezes, or rib retractions  Heart: Regular rate and rhythm, no murmurs or gallops Breast:Examined in sitting and supine position were symmetrical in appearance, no palpable masses or tenderness,  no skin retraction, no nipple inversion, no nipple discharge, no skin discoloration, no axillary or supraclavicular lymphadenopathy Abdomen: no palpable masses or tenderness, no rebound or guarding Extremities: no edema or skin discoloration or tenderness  Pelvic: Vulva: Normal             Vagina: No gross lesions or discharge  Cervix: No gross lesions or discharge  Uterus  AV, normal size, shape and consistency, non-tender and mobile  Adnexa  Without masses or tenderness  Anus: Normal   Assessment/Plan:  64 y.o. female for annual exam   1. Well female exam with routine gynecological exam Normal gynecologic exam.  Pap test July 2019 was negative.  No indication to repeat this year.  Breast exam normal.  Screening mammogram June 2019 was negative, will schedule a screening mammogram now.  Colonoscopy 2021 was negative.  Health labs with family physician.  2. Postmenopause Well on no hormone replacement therapy.  No postmenopausal bleeding since pelvic ultrasound January 2021 when the endometrial lining was normal and thin at 3.06 mm.  Bone density August 2019 was completely normal.  Vitamin D supplements, calcium intake of 1200 mg daily and regular weightbearing physical activity is recommended.  Repeat bone density at 5  years.  3. Overweight (BMI 25.0-29.9) Recommend a lower calorie/carb diet such as [employer].  Aerobic activities 5 times a week and light weightlifting every 2 days.  Princess Bruins MD, 3:11 PM 04/14/2020

## 2020-04-28 DIAGNOSIS — E7849 Other hyperlipidemia: Secondary | ICD-10-CM | POA: Diagnosis not present

## 2020-04-28 DIAGNOSIS — I10 Essential (primary) hypertension: Secondary | ICD-10-CM | POA: Diagnosis not present

## 2020-04-28 DIAGNOSIS — M797 Fibromyalgia: Secondary | ICD-10-CM | POA: Diagnosis not present

## 2020-05-24 DIAGNOSIS — K219 Gastro-esophageal reflux disease without esophagitis: Secondary | ICD-10-CM | POA: Diagnosis not present

## 2020-05-24 DIAGNOSIS — R1012 Left upper quadrant pain: Secondary | ICD-10-CM | POA: Diagnosis not present

## 2020-05-24 DIAGNOSIS — K760 Fatty (change of) liver, not elsewhere classified: Secondary | ICD-10-CM | POA: Diagnosis not present

## 2020-05-24 DIAGNOSIS — Z86010 Personal history of colonic polyps: Secondary | ICD-10-CM | POA: Diagnosis not present

## 2020-06-18 ENCOUNTER — Emergency Department
Admission: EM | Admit: 2020-06-18 | Discharge: 2020-06-18 | Disposition: A | Discharge status: Home / Self Care | Payer: BC Managed Care – PPO | Source: Home / Self Care

## 2020-06-18 ENCOUNTER — Encounter: Payer: Self-pay | Admitting: Family Medicine

## 2020-06-18 ENCOUNTER — Other Ambulatory Visit: Payer: Self-pay

## 2020-06-18 DIAGNOSIS — L03011 Cellulitis of right finger: Secondary | ICD-10-CM | POA: Diagnosis not present

## 2020-06-18 MED ORDER — DOXYCYCLINE HYCLATE 100 MG PO TABS: 100.0000 mg | ORAL_TABLET | Freq: Two times a day (BID) | ORAL | 0 refills | Status: DC

## 2020-06-18 NOTE — ED Provider Notes (Signed)
Katie Shepard CARE    CSN: [id number] Arrival date & time: 06/18/20  1156      History   Chief Complaint Chief Complaint  Patient presents with  . Other    finger infection    HPI Katie Shepard is a 64 y.o. female.   This is the initial visit for this 64 year old woman at [facility] urgent care.  Pt states that she has an infected finger. Pt states that she stuck it with a needle and it is not infected and swollen. Pt states that she is not vaccinated.  Patient is on chronic tramadol.     Past Medical History:  Diagnosis Date  . Arthritis   . Endometrial polyp   . Fibromyalgia    per pt has not been dx by doctor  . GERD (gastroesophageal reflux disease)   . Hyperlipidemia   . Leiomyoma    Multiple small on ultrasound  . Migraine   . PMB (postmenopausal bleeding)   . Wears glasses     Patient Active Problem List   Diagnosis Date Noted  . Post-operative state 06/13/2017    Past Surgical History:  Procedure Laterality Date  . CARDIOVASCULAR STRESS TEST  05/17/2017   dr cooper   Low risk nuclear study w/ chest pain and positive ECG changes noted at peak exertion; mild apical ishemia ; reversible small defect of mild severity present in the apical anterior and apex location;  nuclear stress ef 75% , normal wall motion  . DILATATION & CURETTAGE/HYSTEROSCOPY WITH MYOSURE N/A 05/29/2017   Procedure: DILATATION & CURETTAGE/HYSTEROSCOPY WITH MYOSURE;  Surgeon: Anastasio Auerbach, MD;  Location: [facility];  Service: Gynecology;  Laterality: N/A;  . ORIF RIGHT ANKLE FRACTURE  1992   retained hardware  . TONSILLECTOMY AND ADENOIDECTOMY  age 47  . TRANSTHORACIC ECHOCARDIOGRAM  05/17/2017   dr cooper   ef 55-60%    OB History    Gravida  5   Para  3   Term      Preterm      AB  2   Living  3     SAB  1   TAB  1   Ectopic      Multiple      Live Births               Home Medications    Prior to Admission  medications   Medication Sig Start Date End Date Taking? Authorizing Provider  ALPRAZolam Duanne Shepard) 0.5 MG tablet Take 1 tablet (0.5 mg total) by mouth at bedtime as needed for anxiety. 09/09/19  Yes Fontaine, Belinda Block, MD  atenolol (TENORMIN) 50 MG tablet Take 1 tablet (50 mg total) by mouth daily. 11/13/19 11/07/20 Yes Sherren Mocha, MD  atorvastatin (LIPITOR) 20 MG tablet Take 20 mg by mouth every evening.  04/08/17  Yes [provider]  Cholecalciferol (VITAMIN D3) 2000 units TABS Take 1 tablet by mouth daily.   Yes [provider]  Coenzyme Q10 (COQ-10) 100 MG CAPS Take 1 capsule by mouth daily.   Yes [provider]  FLUoxetine (PROZAC) 10 MG capsule Take 10 mg by mouth every evening.    Yes [provider]  HYDROcodone-acetaminophen (NORCO/VICODIN) 5-325 MG tablet Take 1-2 tablets by mouth daily as needed for migraine. 02/19/17  Yes [provider]  loratadine (CLARITIN) 10 MG tablet Take 10 mg by mouth daily as needed for allergies.   Yes [provider]  multivitamin-lutein (OCUVITE-LUTEIN) CAPS capsule  Take 1 capsule by mouth daily.   Yes [provider]  zolmitriptan (ZOMIG) 5 MG nasal solution Place 1 spray into the nose daily as needed for migraine.    Yes [provider]  doxycycline (VIBRA-TABS) 100 MG tablet Take 1 tablet (100 mg total) by mouth 2 (two) times daily. 06/18/20   Robyn Haber, MD  lidocaine (LIDODERM) 5 % Place 1 patch onto the skin daily. Remove & Discard patch within 12 hours or as directed by MD 08/11/18   Lorayne Bender, PA-C    Family History Family History  Problem Relation Age of Onset  . Hypertension Father   . Heart disease Father   . Aortic stenosis Father   . Stroke Mother   . Obesity Sister     Social History Social History   Tobacco Use  . Smoking status: Never Smoker  . Smokeless tobacco: Never Used  Vaping Use  . Vaping Use: Never used  Substance Use Topics  . Alcohol  use: Yes    Alcohol/week: 0.0 standard drinks    Comment: occasional  . Drug use: No     Allergies   Patient has no known allergies.   Review of Systems Review of Systems   Physical Exam Triage Vital Signs ED Triage Vitals [06/18/20 1209]  Enc Vitals Group     BP      Pulse      Resp      Temp      Temp src      SpO2      Weight 169 lb (76.7 kg)     Height 5\' 4"  (1.626 m)     Head Circumference      Peak Flow      Pain Score 5     Pain Loc      Pain Edu?      Excl. in [place]?    No data found.  Updated Vital Signs BP 128/80   Pulse 70   Ht 5\' 4"  (1.626 m)   Wt 76.7 kg   LMP 03/16/2017   SpO2 97%   BMI 29.01 kg/m    Physical Exam Vitals and nursing note reviewed.  Constitutional:      Appearance: Normal appearance.  Eyes:     Conjunctiva/sclera: Conjunctivae normal.  Pulmonary:     Effort: Pulmonary effort is normal.  Musculoskeletal:        General: Swelling, tenderness and signs of injury present. Normal range of motion.     Cervical back: Normal range of motion and neck supple.  Skin:    Findings: Erythema present.     Comments: Paronychia right middle finger  Neurological:     General: No focal deficit present.     Mental Status: She is alert.  Psychiatric:        Mood and Affect: Mood normal.      UC Treatments / Results  Labs (all labs ordered are listed, but only abnormal results are displayed) Labs Reviewed - No data to display  EKG   Radiology No results found.  Procedures Procedures (including critical care time)  Medications Ordered in UC Medications - No data to display  Initial Impression / Assessment and Plan / UC Course  I have reviewed the triage vital signs and the nursing notes.  Pertinent labs & imaging results that were available during my care of the patient were reviewed by me and considered in my medical decision making (see chart for details).  Final Clinical Impressions(s) / UC Diagnoses   Final  diagnoses:  Paronychia of right middle finger   Discharge Instructions   None    ED Prescriptions    Medication Sig Dispense Auth. Provider   doxycycline (VIBRA-TABS) 100 MG tablet Take 1 tablet (100 mg total) by mouth 2 (two) times daily. 14 tablet Robyn Haber, MD     I have reviewed the PDMP during this encounter.   Robyn Haber, MD 06/18/20 1220

## 2020-06-18 NOTE — ED Triage Notes (Signed)
Pt states that she has an infected finger. Pt states that she stuck it with a needle and it is not infected and swollen. Pt states that she is not vaccinated.

## 2020-07-12 DIAGNOSIS — H43813 Vitreous degeneration, bilateral: Secondary | ICD-10-CM | POA: Diagnosis not present

## 2020-07-20 DIAGNOSIS — D2272 Melanocytic nevi of left lower limb, including hip: Secondary | ICD-10-CM | POA: Diagnosis not present

## 2020-07-20 DIAGNOSIS — L814 Other melanin hyperpigmentation: Secondary | ICD-10-CM | POA: Diagnosis not present

## 2020-07-20 DIAGNOSIS — D225 Melanocytic nevi of trunk: Secondary | ICD-10-CM | POA: Diagnosis not present

## 2020-07-20 DIAGNOSIS — L821 Other seborrheic keratosis: Secondary | ICD-10-CM | POA: Diagnosis not present

## 2020-07-20 DIAGNOSIS — L820 Inflamed seborrheic keratosis: Secondary | ICD-10-CM | POA: Diagnosis not present

## 2020-08-29 ENCOUNTER — Telehealth (HOSPITAL_COMMUNITY): Payer: Self-pay | Admitting: Emergency Medicine

## 2020-08-29 DIAGNOSIS — U071 COVID-19: Secondary | ICD-10-CM | POA: Diagnosis not present

## 2020-08-29 DIAGNOSIS — R059 Cough, unspecified: Secondary | ICD-10-CM | POA: Diagnosis not present

## 2020-08-29 NOTE — Telephone Encounter (Signed)
Called pt and explained possible monoclonal antibody treatment. Sx started 11/23. Tested positive 11/29 at [facility]. Sx include fever, body aches, nausea, diarrhea, sore throat, congestion, and headache. Qualifying risk factors include HTN and BMI 29. Pt interested in tx. Not vaccinated. Informed pt an APP will call back to possibly schedule an appointment.

## 2020-08-30 ENCOUNTER — Other Ambulatory Visit: Payer: Self-pay | Admitting: Unknown Physician Specialty

## 2020-08-30 ENCOUNTER — Ambulatory Visit (HOSPITAL_COMMUNITY)
Admission: RE | Admit: 2020-08-30 | Discharge: 2020-08-30 | Disposition: A | Discharge status: Home / Self Care | Payer: BC Managed Care – PPO | Source: Ambulatory Visit | Attending: Pulmonary Disease | Admitting: Pulmonary Disease

## 2020-08-30 ENCOUNTER — Telehealth: Payer: Self-pay | Admitting: Unknown Physician Specialty

## 2020-08-30 ENCOUNTER — Encounter: Payer: Self-pay | Admitting: Unknown Physician Specialty

## 2020-08-30 ENCOUNTER — Other Ambulatory Visit: Payer: Self-pay | Admitting: Nurse Practitioner

## 2020-08-30 DIAGNOSIS — E663 Overweight: Secondary | ICD-10-CM

## 2020-08-30 DIAGNOSIS — U071 COVID-19: Secondary | ICD-10-CM

## 2020-08-30 DIAGNOSIS — I10 Essential (primary) hypertension: Secondary | ICD-10-CM | POA: Diagnosis not present

## 2020-08-30 MED ORDER — EPINEPHRINE 0.3 MG/0.3ML IJ SOAJ: 0.3000 mg | Freq: Once | INTRAMUSCULAR | Status: DC | PRN

## 2020-08-30 MED ORDER — DIPHENHYDRAMINE HCL 50 MG/ML IJ SOLN: 50.0000 mg | Freq: Once | INTRAMUSCULAR | Status: DC | PRN

## 2020-08-30 MED ORDER — ONDANSETRON 8 MG PO TBDP: 8.0000 mg | ORAL_TABLET | Freq: Three times a day (TID) | ORAL | 1 refills | Status: DC | PRN

## 2020-08-30 MED ORDER — SOTROVIMAB 500 MG/8ML IV SOLN
500.0000 mg | Freq: Once | INTRAVENOUS | Status: AC
  Administered 2020-08-30: 500 mg via INTRAVENOUS

## 2020-08-30 MED ORDER — METHYLPREDNISOLONE SODIUM SUCC 125 MG IJ SOLR: 125.0000 mg | Freq: Once | INTRAMUSCULAR | Status: DC | PRN

## 2020-08-30 MED ORDER — ONDANSETRON HCL 4 MG/2ML IJ SOLN
4.0000 mg | Freq: Once | INTRAMUSCULAR | Status: AC
  Administered 2020-08-30: 4 mg via INTRAVENOUS
  Filled 2020-08-30: qty 2

## 2020-08-30 MED ORDER — SODIUM CHLORIDE 0.9 % IV SOLN: INTRAVENOUS | Status: DC | PRN

## 2020-08-30 MED ORDER — ALBUTEROL SULFATE HFA 108 (90 BASE) MCG/ACT IN AERS: 2.0000 | INHALATION_SPRAY | Freq: Once | RESPIRATORY_TRACT | Status: DC | PRN

## 2020-08-30 MED ORDER — FAMOTIDINE IN NACL 20-0.9 MG/50ML-% IV SOLN: 20.0000 mg | Freq: Once | INTRAVENOUS | Status: DC | PRN

## 2020-08-30 NOTE — Progress Notes (Signed)
Patient reviewed Fact Sheet for Patients, Parents, and Caregivers for Emergency Use Authorization (EUA) of Sotrovimab for the Treatment of Coronavirus. Patient also reviewed and is agreeable to the estimated cost of treatment. Patient is agreeable to proceed.   

## 2020-08-30 NOTE — Discharge Instructions (Signed)
10 Things You Can Do to Manage Your COVID-19 Symptoms at Home If you have possible or confirmed COVID-19: 1. Stay home from work and school. And stay away from other public places. If you must go out, avoid using any kind of public transportation, ridesharing, or taxis. 2. Monitor your symptoms carefully. If your symptoms get worse, call your healthcare provider immediately. 3. Get rest and stay hydrated. 4. If you have a medical appointment, call the healthcare provider ahead of time and tell them that you have or may have COVID-19. 5. For medical emergencies, call 911 and notify the dispatch personnel that you have or may have COVID-19. 6. Cover your cough and sneezes with a tissue or use the inside of your elbow. 7. Wash your hands often with soap and water for at least 20 seconds or clean your hands with an alcohol-based hand sanitizer that contains at least 60% alcohol. 8. As much as possible, stay in a specific room and away from other people in your home. Also, you should use a separate bathroom, if available. If you need to be around other people in or outside of the home, wear a mask. 9. Avoid sharing personal items with other people in your household, like dishes, towels, and bedding. 10. Clean all surfaces that are touched often, like counters, tabletops, and doorknobs. Use household cleaning sprays or wipes according to the label instructions. cdc.gov/coronavirus 04/01/2019 This information is not intended to replace advice given to you by your health care provider. Make sure you discuss any questions you have with your health care provider. Document Revised: 09/03/2019 Document Reviewed: 09/03/2019 Elsevier Patient Education  2020 Elsevier Inc.  What types of side effects do monoclonal antibody drugs cause?  Common side effects  In general, the more common side effects caused by monoclonal antibody drugs include: . Allergic reactions, such as hives or itching . Flu-like signs and  symptoms, including chills, fatigue, fever, and muscle aches and pains . Nausea, vomiting . Diarrhea . Skin rashes . Low blood pressure   The CDC is recommending patients who receive monoclonal antibody treatments wait at least 90 days before being vaccinated.  Currently, there are no data on the safety and efficacy of mRNA COVID-19 vaccines in persons who received monoclonal antibodies or convalescent plasma as part of COVID-19 treatment. Based on the estimated half-life of such therapies as well as evidence suggesting that reinfection is uncommon in the 90 days after initial infection, vaccination should be deferred for at least 90 days, as a precautionary measure until additional information becomes available, to avoid interference of the antibody treatment with vaccine-induced immune responses.  If you have any questions or concerns after the infusion please call the Advanced Practice Provider on call at [phone]. This number is only intended for your use regarding questions or concerns about the infusion post-treatment side-effects.  Please do not provide this number to others for use.   If someone you know is interested in receiving treatment please have them call the COVID hotline at [phone].   

## 2020-08-30 NOTE — Telephone Encounter (Signed)
I connected by phone with Katie Shepard on 08/30/2020 at 9:44 AM to discuss the potential use of a new treatment for mild to moderate COVID-19 viral infection in non-hospitalized patients.  This patient is a 63 y.o. female that meets the FDA criteria for Emergency Use Authorization of COVID monoclonal antibody casirivimab/imdevimab, bamlanivimab/eteseviamb, or sotrovimab.  Has a (+) direct SARS-CoV-2 viral test result  Has mild or moderate COVID-19   Is NOT hospitalized due to COVID-19  Is within 10 days of symptom onset  Has at least one of the high risk factor(s) for progression to severe COVID-19 and/or hospitalization as defined in EUA.  Specific high risk criteria : Cardiovascular disease or hypertension   I have spoken and communicated the following to the patient or parent/caregiver regarding COVID monoclonal antibody treatment:  1. FDA has authorized the emergency use for the treatment of mild to moderate COVID-19 in adults and pediatric patients with positive results of direct SARS-CoV-2 viral testing who are 67 years of age and older weighing at least 40 kg, and who are at high risk for progressing to severe COVID-19 and/or hospitalization.  2. The significant known and potential risks and benefits of COVID monoclonal antibody, and the extent to which such potential risks and benefits are unknown.  3. Information on available alternative treatments and the risks and benefits of those alternatives, including clinical trials.  4. Patients treated with COVID monoclonal antibody should continue to self-isolate and use infection control measures (e.g., wear mask, isolate, social distance, avoid sharing personal items, clean and disinfect high touch surfaces, and frequent handwashing) according to CDC guidelines.   5. The patient or parent/caregiver has the option to accept or refuse COVID monoclonal antibody treatment.  After reviewing this information with the patient, the  patient has agreed to receive one of the available covid 19 monoclonal antibodies and will be provided an appropriate fact sheet prior to infusion. [name], NP 08/30/2020 9:44 AM  Sx onset 11/25

## 2020-08-30 NOTE — Progress Notes (Signed)
Diagnosis: COVID-19  Physician: Dr. Patrick Wright  Procedure: Covid Infusion Clinic Med: Sotrovimab infusion - Provided patient with sotrovimab fact sheet for patients, parents, and caregivers prior to infusion.   Complications: No immediate complications noted  Discharge: Discharged home    

## 2020-09-19 ENCOUNTER — Other Ambulatory Visit: Payer: Self-pay | Admitting: *Deleted

## 2020-09-19 MED ORDER — ALPRAZOLAM 0.5 MG PO TABS: 0.5000 mg | ORAL_TABLET | Freq: Every evening | ORAL | 1 refills | Status: DC | PRN

## 2020-11-17 DIAGNOSIS — H04121 Dry eye syndrome of right lacrimal gland: Secondary | ICD-10-CM | POA: Diagnosis not present

## 2020-11-30 ENCOUNTER — Other Ambulatory Visit: Payer: Self-pay | Admitting: Cardiovascular Disease

## 2021-02-01 DIAGNOSIS — E785 Hyperlipidemia, unspecified: Secondary | ICD-10-CM | POA: Diagnosis not present

## 2021-02-08 DIAGNOSIS — I10 Essential (primary) hypertension: Secondary | ICD-10-CM | POA: Diagnosis not present

## 2021-02-08 DIAGNOSIS — Z0000 Encounter for general adult medical examination without abnormal findings: Secondary | ICD-10-CM | POA: Diagnosis not present

## 2021-02-08 DIAGNOSIS — R82998 Other abnormal findings in urine: Secondary | ICD-10-CM | POA: Diagnosis not present

## 2021-04-05 DIAGNOSIS — I10 Essential (primary) hypertension: Secondary | ICD-10-CM | POA: Diagnosis not present

## 2021-04-05 DIAGNOSIS — H5203 Hypermetropia, bilateral: Secondary | ICD-10-CM | POA: Diagnosis not present

## 2021-05-04 DIAGNOSIS — M25512 Pain in left shoulder: Secondary | ICD-10-CM | POA: Diagnosis not present

## 2021-05-04 DIAGNOSIS — M13841 Other specified arthritis, right hand: Secondary | ICD-10-CM | POA: Diagnosis not present

## 2021-05-04 DIAGNOSIS — M79642 Pain in left hand: Secondary | ICD-10-CM | POA: Diagnosis not present

## 2021-05-04 DIAGNOSIS — M79641 Pain in right hand: Secondary | ICD-10-CM | POA: Diagnosis not present

## 2021-05-30 DIAGNOSIS — M25512 Pain in left shoulder: Secondary | ICD-10-CM | POA: Diagnosis not present

## 2021-06-13 DIAGNOSIS — M25512 Pain in left shoulder: Secondary | ICD-10-CM | POA: Diagnosis not present

## 2021-06-15 DIAGNOSIS — M79642 Pain in left hand: Secondary | ICD-10-CM | POA: Diagnosis not present

## 2021-06-15 DIAGNOSIS — M79641 Pain in right hand: Secondary | ICD-10-CM | POA: Diagnosis not present

## 2021-06-15 DIAGNOSIS — M13841 Other specified arthritis, right hand: Secondary | ICD-10-CM | POA: Diagnosis not present

## 2021-06-21 DIAGNOSIS — M25512 Pain in left shoulder: Secondary | ICD-10-CM | POA: Diagnosis not present

## 2021-06-28 DIAGNOSIS — M25512 Pain in left shoulder: Secondary | ICD-10-CM | POA: Diagnosis not present

## 2021-07-07 DIAGNOSIS — M25512 Pain in left shoulder: Secondary | ICD-10-CM | POA: Diagnosis not present

## 2021-07-11 DIAGNOSIS — M25512 Pain in left shoulder: Secondary | ICD-10-CM | POA: Diagnosis not present

## 2021-07-18 DIAGNOSIS — M542 Cervicalgia: Secondary | ICD-10-CM | POA: Diagnosis not present

## 2021-08-08 DIAGNOSIS — M542 Cervicalgia: Secondary | ICD-10-CM | POA: Diagnosis not present

## 2021-08-15 DIAGNOSIS — M25512 Pain in left shoulder: Secondary | ICD-10-CM | POA: Diagnosis not present

## 2021-08-15 DIAGNOSIS — M542 Cervicalgia: Secondary | ICD-10-CM | POA: Diagnosis not present

## 2021-08-18 DIAGNOSIS — I10 Essential (primary) hypertension: Secondary | ICD-10-CM | POA: Diagnosis not present

## 2021-08-18 DIAGNOSIS — M797 Fibromyalgia: Secondary | ICD-10-CM | POA: Diagnosis not present

## 2021-08-22 DIAGNOSIS — M542 Cervicalgia: Secondary | ICD-10-CM | POA: Diagnosis not present

## 2021-10-19 ENCOUNTER — Ambulatory Visit: Payer: BC Managed Care – PPO | Admitting: Obstetrics & Gynecology

## 2022-01-11 ENCOUNTER — Ambulatory Visit: Payer: BC Managed Care – PPO | Admitting: Obstetrics & Gynecology

## 2022-01-19 ENCOUNTER — Ambulatory Visit (INDEPENDENT_AMBULATORY_CARE_PROVIDER_SITE_OTHER): Payer: BC Managed Care – PPO | Admitting: Obstetrics & Gynecology

## 2022-01-19 ENCOUNTER — Other Ambulatory Visit (HOSPITAL_COMMUNITY)
Admission: RE | Admit: 2022-01-19 | Discharge: 2022-01-19 | Disposition: A | Discharge status: Home / Self Care | Payer: BC Managed Care – PPO | Source: Ambulatory Visit | Attending: Obstetrics & Gynecology | Admitting: Obstetrics & Gynecology

## 2022-01-19 ENCOUNTER — Encounter: Payer: Self-pay | Admitting: Obstetrics & Gynecology

## 2022-01-19 VITALS — BP 122/80 | HR 68 | Resp 16 | Ht 64.0 in | Wt 182.0 lb

## 2022-01-19 DIAGNOSIS — E6609 Other obesity due to excess calories: Secondary | ICD-10-CM

## 2022-01-19 DIAGNOSIS — Z01419 Encounter for gynecological examination (general) (routine) without abnormal findings: Secondary | ICD-10-CM

## 2022-01-19 DIAGNOSIS — Z780 Asymptomatic menopausal state: Secondary | ICD-10-CM

## 2022-01-19 DIAGNOSIS — Z6831 Body mass index (BMI) 31.0-31.9, adult: Secondary | ICD-10-CM

## 2022-01-19 NOTE — Progress Notes (Signed)
? ? ?Katie Shepard [birth year] [id number] ? ? ?History:    66 y.o. [id number]  Married.  Has 3 grand-children. ?  ?RP: Established patient for Annual Gynecologic exam ?  ?HPI: Well on no HRT since 09/2019.  No PMB.  No pelvic pain.  Sexually active with husband, no pain with IC.  Previous Paps normal.  Last Pap Neg 03/2018.  Pap reflex today.  Urine/BMs normal. Breasts normal. Mammo scheduled next Friday. BMI 31.24.  Active at work.  BD Normal in 05/2018.  Health Labs with [place] Negative 2021. ? ?Past medical history,surgical history, family history and social history were all reviewed and documented in the EPIC chart. ? ?Gynecologic History ?Patient's last menstrual period was 03/16/2017. ? ?Obstetric History ?OB History  ?Gravida Para Term Preterm AB Living  ?'5 3     2 3  '$ ?SAB IAB Ectopic Multiple Live Births  ?1 1        ?  ?# Outcome Date GA Lbr Len/2nd Weight Sex Delivery Anes PTL Lv  ?5 SAB           ?4 IAB           ?3 Para           ?2 Para           ?1 Para           ? ? ? ?ROS: A ROS was performed and pertinent positives and negatives are included in the history. ? GENERAL: No fevers or chills. HEENT: No change in vision, no earache, sore throat or sinus congestion. NECK: No pain or stiffness. CARDIOVASCULAR: No chest pain or pressure. No palpitations. PULMONARY: No shortness of breath, cough or wheeze. GASTROINTESTINAL: No abdominal pain, nausea, vomiting or diarrhea, melena or bright red blood per rectum. GENITOURINARY: No urinary frequency, urgency, hesitancy or dysuria. MUSCULOSKELETAL: No joint or muscle pain, no back pain, no recent trauma. DERMATOLOGIC: No rash, no itching, no lesions. ENDOCRINE: No polyuria, polydipsia, no heat or cold intolerance. No recent change in weight. HEMATOLOGICAL: No anemia or easy bruising or bleeding. NEUROLOGIC: No headache, seizures, numbness, tingling or weakness. PSYCHIATRIC: No depression, no loss of interest in normal activity or change in sleep pattern.  ?   ? ?Exam: ? ? ?BP 122/80   Pulse 68   Resp 16   Ht '5\' 4"'$  (1.626 m)   Wt 182 lb (82.6 kg)   LMP 03/16/2017   BMI 31.24 kg/m?  ? ?Body mass index is 31.24 kg/m?. ? ?General appearance : Well developed well nourished female. No acute distress ?HEENT: Eyes: no retinal hemorrhage or exudates,  Neck supple, trachea midline, no carotid bruits, no thyroidmegaly ?Lungs: Clear to auscultation, no rhonchi or wheezes, or rib retractions  ?Heart: Regular rate and rhythm, no murmurs or gallops ?Breast:Examined in sitting and supine position were symmetrical in appearance, no palpable masses or tenderness,  no skin retraction, no nipple inversion, no nipple discharge, no skin discoloration, no axillary or supraclavicular lymphadenopathy ?Abdomen: no palpable masses or tenderness, no rebound or guarding ?Extremities: no edema or skin discoloration or tenderness ? ?Pelvic: Vulva: Normal ?            Vagina: No gross lesions or discharge ? Cervix: No gross lesions or discharge.  Pap reflex done. ? Uterus  AV, normal size, shape and consistency, non-tender and mobile ? Adnexa  Without masses or tenderness ? Anus: Normal ? ? ?Assessment/Plan:  66 y.o. female for annual exam  ? ?  1. Encounter for routine gynecological examination with Papanicolaou smear of cervix ?Well on no HRT since 09/2019.  No PMB.  No pelvic pain.  Sexually active with husband, no pain with IC.  Previous Paps normal.  Last Pap Neg 03/2018.  Pap reflex today.  Urine/BMs normal. Breasts normal. Mammo scheduled next Friday.   BMI 31.24.  Active at work. BD Normal in 05/2018.  Health Labs with [place] Negative 2021. ?- Cytology - PAP( [place]) ? ?2. Postmenopause ?Well on no HRT since 09/2019.  No PMB.  No pelvic pain.  Sexually active with husband, no pain with IC.   ? ?3. Class 1 obesity due to excess calories without serious comorbidity with body mass index (BMI) of 31.0 to 31.9 in adult ? BMI 31.24.  Active at work, increase fitness activities. Low  calorie/carb diet. ? ?Other orders ?- omeprazole (PRILOSEC) 40 MG capsule; Take 40 mg by mouth 2 (two) times daily.  ? ?Princess Bruins MD, 11:12 AM 01/19/2022 ? ?  ?

## 2022-01-23 LAB — CYTOLOGY - PAP: Diagnosis: NEGATIVE

## 2022-02-01 ENCOUNTER — Encounter: Payer: Self-pay | Admitting: Cardiovascular Disease

## 2022-02-01 ENCOUNTER — Ambulatory Visit: Payer: BC Managed Care – PPO | Admitting: Cardiovascular Disease

## 2022-02-01 ENCOUNTER — Other Ambulatory Visit: Payer: Self-pay | Admitting: Internal Medicine

## 2022-02-01 VITALS — BP 136/82 | HR 69 | Ht 64.0 in | Wt 181.8 lb

## 2022-02-01 DIAGNOSIS — Z1231 Encounter for screening mammogram for malignant neoplasm of breast: Secondary | ICD-10-CM

## 2022-02-01 DIAGNOSIS — E782 Mixed hyperlipidemia: Secondary | ICD-10-CM

## 2022-02-01 DIAGNOSIS — I10 Essential (primary) hypertension: Secondary | ICD-10-CM | POA: Diagnosis not present

## 2022-02-01 MED ORDER — ATENOLOL 25 MG PO TABS: 25.0000 mg | ORAL_TABLET | Freq: Every day | ORAL | 3 refills | Status: DC

## 2022-02-01 MED ORDER — LOSARTAN POTASSIUM 50 MG PO TABS: 50.0000 mg | ORAL_TABLET | Freq: Every day | ORAL | 3 refills | Status: DC

## 2022-02-01 NOTE — Patient Instructions (Signed)
Medication Instructions:  ?DECREASE Atenolol to '25mg'$  once daily ?START Losartan '50mg'$  daily ?*If you need a refill on your cardiac medications before your next appointment, please call your pharmacy* ? ? ?Lab Work: ?NONE ?If you have labs (blood work) drawn today and your tests are completely normal, you will receive your results only by: ?MyChart Message (if you have MyChart) OR ?A paper copy in the mail ?If you have any lab test that is abnormal or we need to change your treatment, we will call you to review the results. ? ? ?Testing/Procedures: ?NONE ? ?Follow-Up: ?At [facility], you and your health needs are our priority.  As part of our continuing mission to provide you with exceptional heart care, we have created designated Provider Care Teams.  These Care Teams include your primary Cardiologist (physician) and Advanced Practice Providers (APPs -  Physician Assistants and Nurse Practitioners) who all work together to provide you with the care you need, when you need it. ? ?Your next appointment:   ?1 year(s) ? ?The format for your next appointment:   ?In Person ? ?Provider:   ?Sherren Mocha, MD   ? ?  ? ?Important Information About Sugar ? ? ? ? ?  ?

## 2022-02-01 NOTE — Progress Notes (Signed)
?Cardiology Office Note:   ? ?Date:  02/01/2022  ? ?ID:  Katie Shepard, DOB 1956-02-02, MRN [id number] ? ?PCP:  Burnard Bunting, MD ?  ?[facility] Providers ?Cardiologist:  Sherren Mocha, MD    ? ?Referring MD: Burnard Bunting, MD  ? ?Chief Complaint  ?Patient presents with  ? Hypertension  ? ? ?History of Present Illness:   ? ?Katie Shepard is a 66 y.o. female with a hx of chest pain, presenting for follow-up evaluation.  She was last seen about 2 years ago in the office for follow-up of chest pain.  At the time of her initial evaluation in 2018, she underwent echo and nuclear stress testing studies, both demonstrating normal findings.  The patient has hypertension and has been treated with atenolol.  She also has mixed hyperlipidemia and has been treated with atorvastatin. ? ?The patient is here alone today.  She has been under a lot of stress and has had occasional chest pain that she feels is stress related.  She has palpitations that are unchanged and frequency or severity.  She states that her blood pressure has been more elevated of late.  No edema, orthopnea, or PND. ? ?Past Medical History:  ?Diagnosis Date  ? Arthritis   ? Endometrial polyp   ? Fibromyalgia   ? per pt has not been dx by doctor  ? GERD (gastroesophageal reflux disease)   ? Hyperlipidemia   ? Leiomyoma   ? Multiple small on ultrasound  ? Migraine   ? PMB (postmenopausal bleeding)   ? Wears glasses   ? ? ?Past Surgical History:  ?Procedure Laterality Date  ? CARDIOVASCULAR STRESS TEST  05/17/2017   dr Deryck Hippler  ? Low risk nuclear study w/ chest pain and positive ECG changes noted at peak exertion; mild apical ishemia ; reversible small defect of mild severity present in the apical anterior and apex location;  nuclear stress ef 75% , normal wall motion  ? DILATATION & CURETTAGE/HYSTEROSCOPY WITH MYOSURE N/A 05/29/2017  ? Procedure: [place];  Surgeon: Anastasio Auerbach, MD;  Location: [facility];  Service: Gynecology;  Laterality: N/A;  ? ORIF RIGHT ANKLE FRACTURE  1992  ? retained hardware  ? TONSILLECTOMY AND ADENOIDECTOMY  age 67  ? TRANSTHORACIC ECHOCARDIOGRAM  05/17/2017   dr Kriya Westra  ? ef 55-60%  ? ? ?Current Medications: ?Current Meds  ?Medication Sig  ? ALPRAZolam (XANAX) 0.5 MG tablet Take 1 tablet (0.5 mg total) by mouth at bedtime as needed for anxiety.  ? atenolol (TENORMIN) 25 MG tablet Take 1 tablet (25 mg total) by mouth daily.  ? atorvastatin (LIPITOR) 20 MG tablet Take 20 mg by mouth every evening.   ? Cholecalciferol (VITAMIN D3) 2000 units TABS Take 1 tablet by mouth daily.  ? Coenzyme Q10 (COQ-10) 100 MG CAPS Take 1 capsule by mouth daily.  ? FLUoxetine (PROZAC) 10 MG capsule Take 10 mg by mouth every evening.   ? HYDROcodone-acetaminophen (NORCO/VICODIN) 5-325 MG tablet Take 1-2 tablets by mouth daily as needed for migraine.  ? loratadine (CLARITIN) 10 MG tablet Take 10 mg by mouth daily as needed for allergies.  ? losartan (COZAAR) 50 MG tablet Take 1 tablet (50 mg total) by mouth daily.  ? multivitamin-lutein (OCUVITE-LUTEIN) CAPS capsule Take 1 capsule by mouth daily.  ? omeprazole (PRILOSEC) 40 MG capsule Take 40 mg by mouth 2 (two) times daily.  ? ondansetron (ZOFRAN-ODT) 8 MG disintegrating tablet Take 1 tablet (8  mg total) by mouth every 8 (eight) hours as needed for nausea.  ? zolmitriptan (ZOMIG) 5 MG nasal solution Place 1 spray into the nose daily as needed for migraine.   ? [DISCONTINUED] atenolol (TENORMIN) 50 MG tablet Take 1 tablet (50 mg total) by mouth daily. Please make overdue appt with Dr. Burt Knack before anymore refills. Thank you 1st attempt  ?  ? ?Allergies:   Patient has no known allergies.  ? ?Social History  ? ?Socioeconomic History  ? Marital status: Married  ?  Spouse name: Not on file  ? Number of children: Not on file  ? Years of education: Not on file  ? Highest education level: Not on file  ?Occupational History  ? Not on file  ?Tobacco  Use  ? Smoking status: Never  ? Smokeless tobacco: Never  ?Vaping Use  ? Vaping Use: Never used  ?Substance and Sexual Activity  ? Alcohol use: Not Currently  ?  Comment: occasional  ? Drug use: No  ? Sexual activity: Yes  ?  Birth control/protection: Post-menopausal  ?  Comment: 1st intercourse 66 yo-Fewer than 5 partners  ?Other Topics Concern  ? Not on file  ?Social History Narrative  ? Not on file  ? ?Social Determinants of Health  ? ?Financial Resource Strain: Not on file  ?Food Insecurity: Not on file  ?Transportation Needs: Not on file  ?Physical Activity: Not on file  ?Stress: Not on file  ?Social Connections: Not on file  ?  ? ?Family History: ?The patient's family history includes Aortic stenosis in her father; Heart disease in her father; Hypertension in her father; Obesity in her sister; Stroke in her mother. ? ?ROS:   ?Please see the history of present illness.    ?All other systems reviewed and are negative. ? ?EKGs/Labs/Other Studies Reviewed:   ? ?EKG:  EKG is ordered today.  The ekg ordered today demonstrates normal sinus rhythm 69 bpm, left axis deviation, poor R wave progression consider left anterior fascicular block ? ?Recent Labs: ?No results found for requested labs within last 8760 hours.  ?Recent Lipid Panel ?   ?Component Value Date/Time  ? CHOL 282 (H) 03/27/2016 0947  ? TRIG 466 (H) 03/27/2016 0947  ? HDL 37 (L) 03/27/2016 0947  ? CHOLHDL 7.6 (H) 03/27/2016 0947  ? VLDL NOT CALC 03/27/2016 0947  ? [place] NOT [place] 03/27/2016 0947  ? ? ? ?Risk Assessment/Calculations:   ?  ? ?    ? ?Physical Exam:   ? ?VS:  BP 136/82   Pulse 69   Ht '5\' 4"'$  (1.626 m)   Wt 181 lb 12.8 oz (82.5 kg)   LMP 03/16/2017   BMI 31.21 kg/m?    ? ?Wt Readings from Last 3 Encounters:  ?02/01/22 181 lb 12.8 oz (82.5 kg)  ?01/19/22 182 lb (82.6 kg)  ?06/18/20 169 lb (76.7 kg)  ?  ? ?GEN:  Well nourished, well developed in no acute distress ?HEENT: Normal ?NECK: No JVD; No carotid bruits ?LYMPHATICS: No  lymphadenopathy ?CARDIAC: RRR, no murmurs, rubs, gallops ?RESPIRATORY:  Clear to auscultation without rales, wheezing or rhonchi  ?ABDOMEN: Soft, non-tender, non-distended ?MUSCULOSKELETAL:  No edema; No deformity  ?SKIN: Warm and dry ?NEUROLOGIC:  Alert and oriented x 3 ?PSYCHIATRIC:  Normal affect  ? ?ASSESSMENT:   ? ?1. Mixed hyperlipidemia   ?2. Essential hypertension   ? ?PLAN:   ? ?In order of problems listed above: ? ?Treated with atorvastatin.  Lipids followed by PCP.  Discussed healthy lifestyle. ?Reviewed options for treatment of her hypertension.  Her blood pressure is running above goal much of the time.  She would like to consider switching off of atenolol at some point.  For now, I have recommended reducing atenolol to 25 mg daily and adding losartan 50 mg daily.  She will monitor her blood pressure response.  Could switch her from atenolol to metoprolol succinate in the future if she desires.  She is not keen on trying amlodipine because both her father and brother had a good bit of trouble with leg swelling with use of this drug.  She has follow-up labs in a few weeks with her primary care physician and the addition of losartan would warrant a metabolic panel to evaluate electrolytes and creatinine levels. ? ?   ? ?   ? ? ?Medication Adjustments/Labs and Tests Ordered: ?Current medicines are reviewed at length with the patient today.  Concerns regarding medicines are outlined above.  ?Orders Placed This Encounter  ?Procedures  ? EKG 12-Lead  ? ?Meds ordered this encounter  ?Medications  ? atenolol (TENORMIN) 25 MG tablet  ?  Sig: Take 1 tablet (25 mg total) by mouth daily.  ?  Dispense:  90 tablet  ?  Refill:  3  ? losartan (COZAAR) 50 MG tablet  ?  Sig: Take 1 tablet (50 mg total) by mouth daily.  ?  Dispense:  90 tablet  ?  Refill:  3  ? ? ?Patient Instructions  ?Medication Instructions:  ?DECREASE Atenolol to '25mg'$  once daily ?START Losartan '50mg'$  daily ?*If you need a refill on your cardiac  medications before your next appointment, please call your pharmacy* ? ? ?Lab Work: ?NONE ?If you have labs (blood work) drawn today and your tests are completely normal, you will receive your results only by: ?MyChart Message (if

## 2022-02-02 ENCOUNTER — Ambulatory Visit
Admission: RE | Admit: 2022-02-02 | Discharge: 2022-02-02 | Disposition: A | Discharge status: Home / Self Care | Payer: BC Managed Care – PPO | Source: Ambulatory Visit | Attending: Internal Medicine | Admitting: Internal Medicine

## 2022-02-02 DIAGNOSIS — Z1231 Encounter for screening mammogram for malignant neoplasm of breast: Secondary | ICD-10-CM

## 2022-02-12 DIAGNOSIS — Z79899 Other long term (current) drug therapy: Secondary | ICD-10-CM | POA: Diagnosis not present

## 2022-02-12 DIAGNOSIS — E785 Hyperlipidemia, unspecified: Secondary | ICD-10-CM | POA: Diagnosis not present

## 2022-02-19 DIAGNOSIS — I10 Essential (primary) hypertension: Secondary | ICD-10-CM | POA: Diagnosis not present

## 2022-02-19 DIAGNOSIS — Z0000 Encounter for general adult medical examination without abnormal findings: Secondary | ICD-10-CM | POA: Diagnosis not present

## 2022-02-19 DIAGNOSIS — R82998 Other abnormal findings in urine: Secondary | ICD-10-CM | POA: Diagnosis not present

## 2022-02-19 DIAGNOSIS — Z1331 Encounter for screening for depression: Secondary | ICD-10-CM | POA: Diagnosis not present

## 2022-02-19 DIAGNOSIS — M797 Fibromyalgia: Secondary | ICD-10-CM | POA: Diagnosis not present

## 2022-05-09 ENCOUNTER — Ambulatory Visit: Payer: BC Managed Care – PPO | Admitting: Cardiovascular Disease

## 2022-06-08 IMAGING — MG MM DIGITAL SCREENING BILAT W/ TOMO AND CAD
8 series · 9 of 24 positions shown · non-contrast
Comparison: Previous exam(s).

CLINICAL DATA: Screening.

EXAM:
DIGITAL SCREENING BILATERAL MAMMOGRAM WITH TOMOSYNTHESIS AND CAD
TECHNIQUE: Bilateral screening digital craniocaudal and mediolateral oblique
mammograms were obtained. Bilateral screening digital breast
tomosynthesis was performed. The images were evaluated with
computer-aided detection.

[R CC synth-2D]
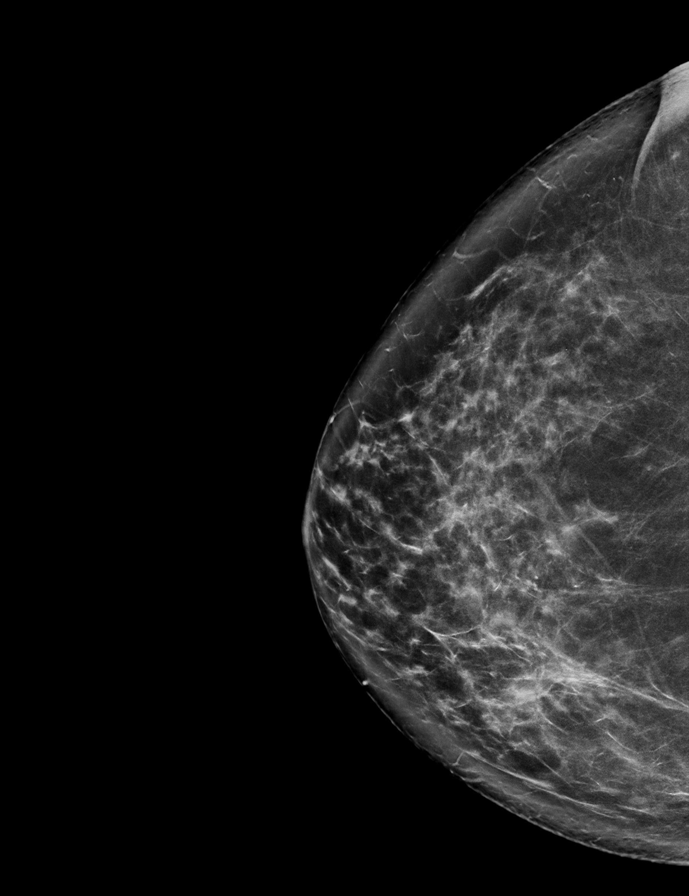

[L CC synth-2D]
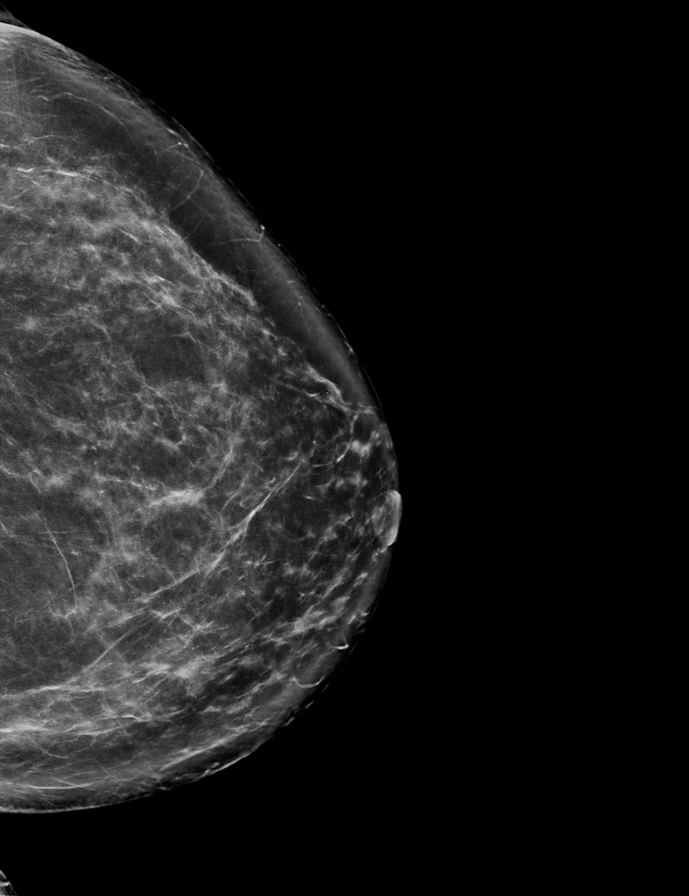

[L MLO synth-2D]
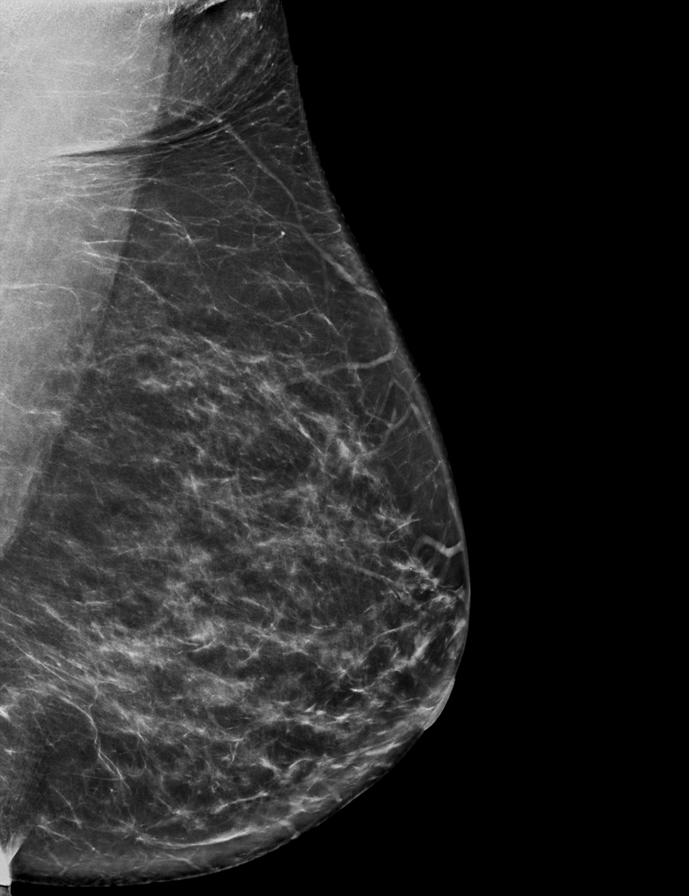

[R MLO synth-2D]
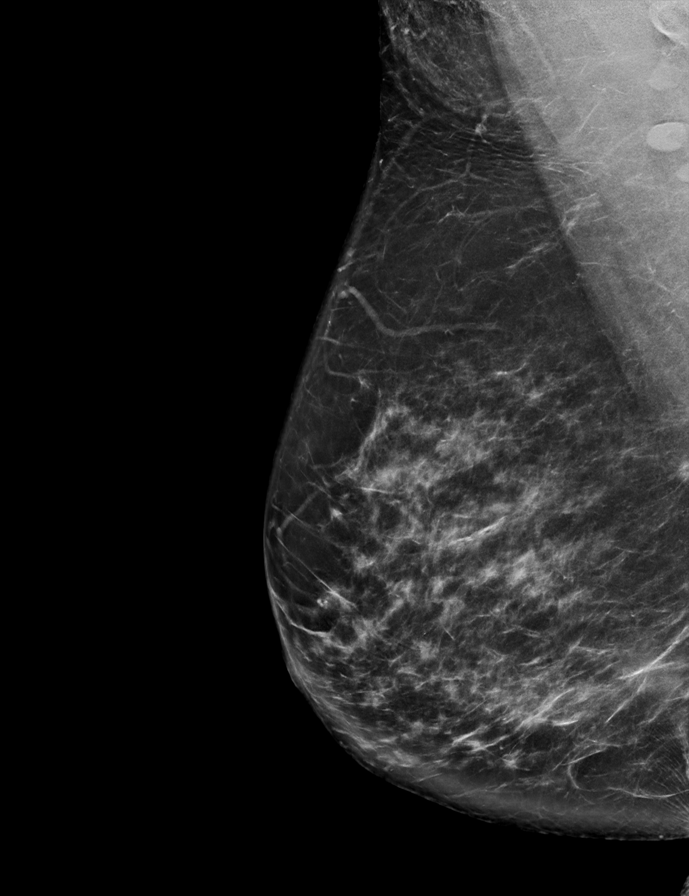

[R MLO tomo · 2 of 80 frames shown]
[frame 26/80]
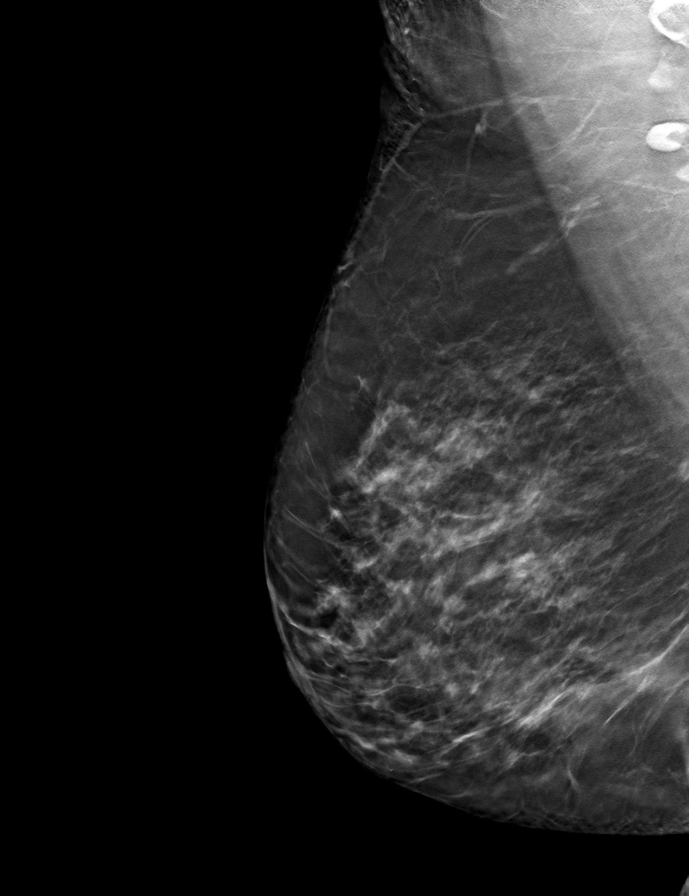
[frame 41/80]
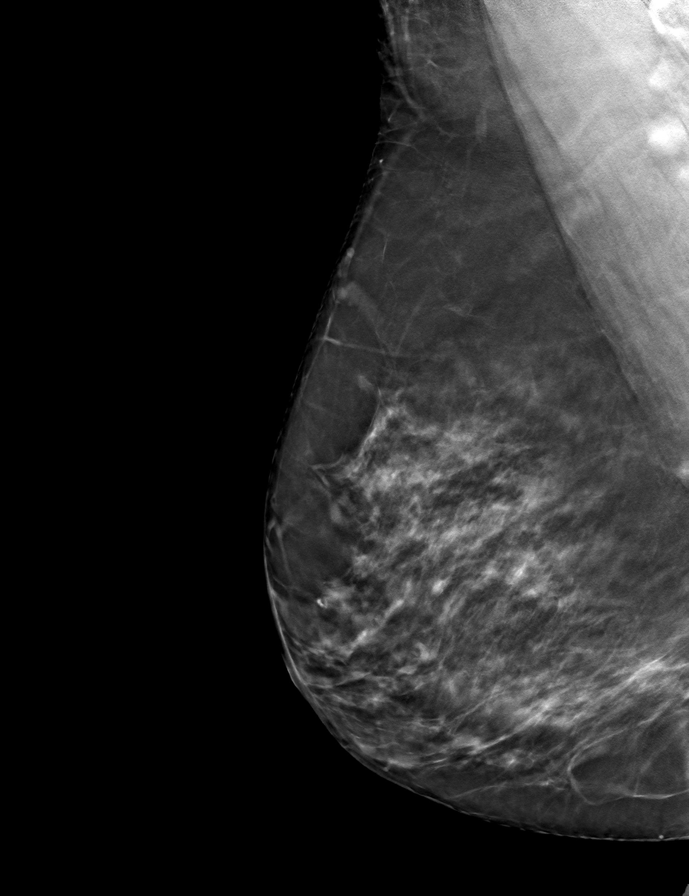

[R CC tomo · tomo slice 40/79.0]
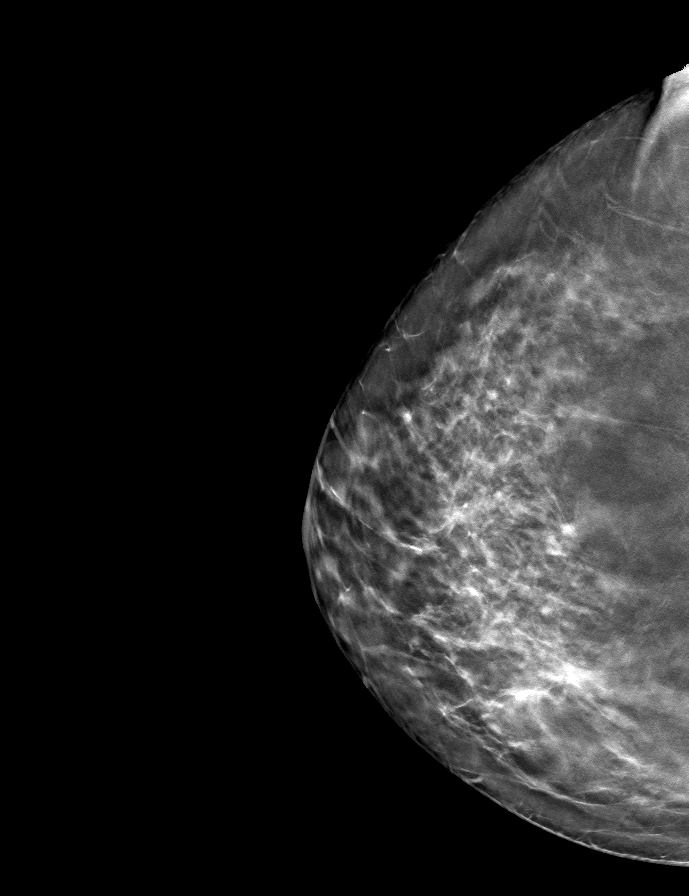

[L CC tomo · tomo slice 41/81.0]
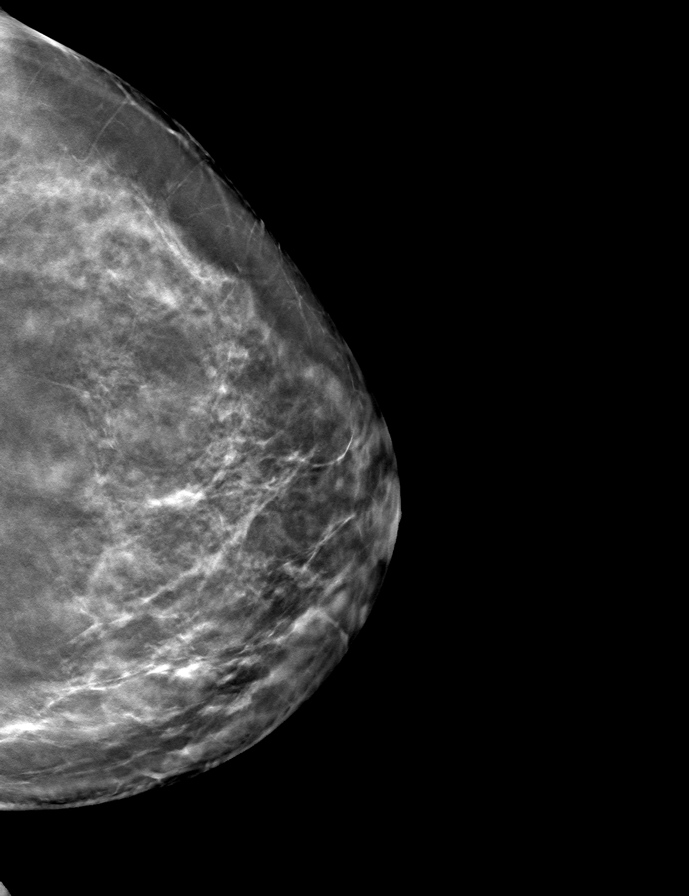

[L MLO tomo · tomo slice 41/80.0]
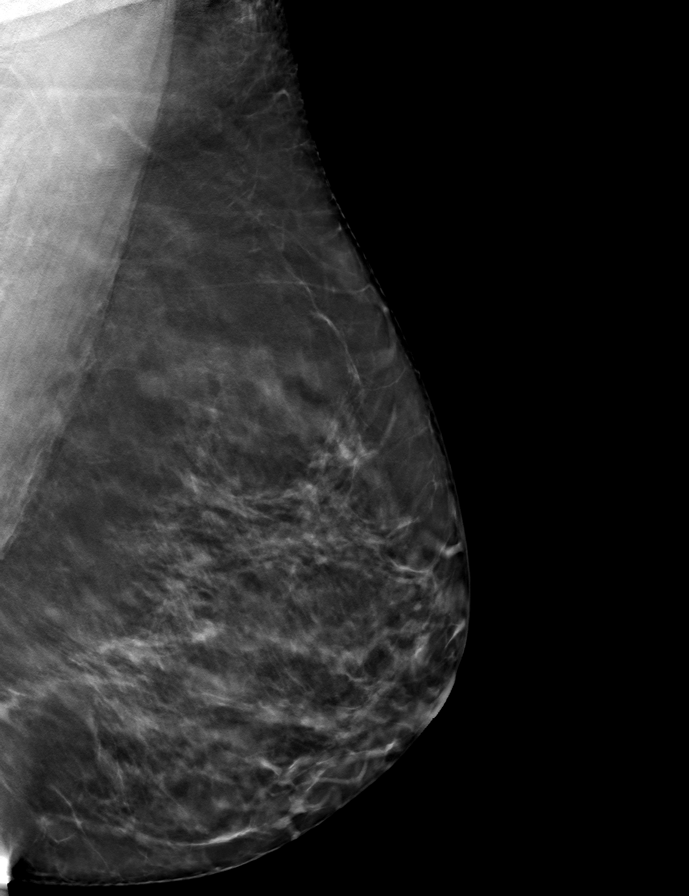

[9 of 24 positions shown; findings below may reference images not displayed]

ACR Breast Density Category c: The breast tissue is heterogeneously
dense, which may obscure small masses.
FINDINGS: There are no findings suspicious for malignancy.
IMPRESSION: No mammographic evidence of malignancy. A result letter of this
screening mammogram will be mailed directly to the patient.

RECOMMENDATION:
Screening mammogram in one year. (Code:Q3-W-BC3)

BI-RADS CATEGORY  1: Negative.

## 2022-06-21 DIAGNOSIS — H53143 Visual discomfort, bilateral: Secondary | ICD-10-CM | POA: Diagnosis not present

## 2022-08-21 DIAGNOSIS — L821 Other seborrheic keratosis: Secondary | ICD-10-CM | POA: Diagnosis not present

## 2022-08-21 DIAGNOSIS — D2272 Melanocytic nevi of left lower limb, including hip: Secondary | ICD-10-CM | POA: Diagnosis not present

## 2022-08-21 DIAGNOSIS — D225 Melanocytic nevi of trunk: Secondary | ICD-10-CM | POA: Diagnosis not present

## 2022-08-21 DIAGNOSIS — D2271 Melanocytic nevi of right lower limb, including hip: Secondary | ICD-10-CM | POA: Diagnosis not present

## 2022-08-29 DIAGNOSIS — G43009 Migraine without aura, not intractable, without status migrainosus: Secondary | ICD-10-CM | POA: Diagnosis not present

## 2022-08-29 DIAGNOSIS — I10 Essential (primary) hypertension: Secondary | ICD-10-CM | POA: Diagnosis not present

## 2022-09-03 DIAGNOSIS — H0279 Other degenerative disorders of eyelid and periocular area: Secondary | ICD-10-CM | POA: Diagnosis not present

## 2022-09-03 DIAGNOSIS — H02423 Myogenic ptosis of bilateral eyelids: Secondary | ICD-10-CM | POA: Diagnosis not present

## 2022-09-03 DIAGNOSIS — H02831 Dermatochalasis of right upper eyelid: Secondary | ICD-10-CM | POA: Diagnosis not present

## 2022-09-03 DIAGNOSIS — H02834 Dermatochalasis of left upper eyelid: Secondary | ICD-10-CM | POA: Diagnosis not present

## 2022-09-03 DIAGNOSIS — H02413 Mechanical ptosis of bilateral eyelids: Secondary | ICD-10-CM | POA: Diagnosis not present

## 2022-09-17 DIAGNOSIS — H53483 Generalized contraction of visual field, bilateral: Secondary | ICD-10-CM | POA: Diagnosis not present

## 2022-09-25 DIAGNOSIS — M542 Cervicalgia: Secondary | ICD-10-CM | POA: Diagnosis not present

## 2022-10-02 ENCOUNTER — Telehealth: Payer: Self-pay | Admitting: Cardiovascular Disease

## 2022-10-02 NOTE — Telephone Encounter (Signed)
Pt made aware of PharmD's recommendations about meloxicam and taking this from a cardiac standpoint.  Pt states she's not having the acute pain anymore, just more of a dull ache, and has decided to hold off on taking this medication at this time.   Pt verbalized understanding and agrees with this plan.  Pt was more than gracious for all the assistance provided.

## 2022-10-02 NOTE — Telephone Encounter (Signed)
Will forward this message to our PharmD team to further review and advise on medication Meloxicam, with pts cardiac history/meds.   Triage to follow-up with the pt accordingly thereafter.

## 2022-10-02 NOTE — Telephone Encounter (Signed)
NSAIDs like Meloxicam can increase blood pressure and if used long term can increase the risk of heart attack and stroke. This risk is higher the longer the medication is used. Unfortunately there is not really other good options for her compressed vertebrae. Depending on her pain, I do think the risk could outweigh her risk, but I would recommend taking the smallest dose for the shortest amount of time needed.

## 2022-10-02 NOTE — Telephone Encounter (Signed)
Pt c/o medication issue:  1. Name of Medication: Meloxicam 15 mg  2. How are you currently taking this medication (dosage and times per day)? Not currently taking  3. Are you having a reaction (difficulty breathing--STAT)? No   4. What is your medication issue? Patient states [facility] prescribed this medication for a compressed vertebrae in her neck.  She looked up the side effects and saw it could cause heart issues, so she is requesting Dr. York Cerise opinion before starting. It is prescribed for her to take 1 tablet daily for 30 days.

## 2022-11-13 DIAGNOSIS — H02834 Dermatochalasis of left upper eyelid: Secondary | ICD-10-CM | POA: Diagnosis not present

## 2022-11-13 DIAGNOSIS — H02831 Dermatochalasis of right upper eyelid: Secondary | ICD-10-CM | POA: Diagnosis not present

## 2023-01-22 ENCOUNTER — Ambulatory Visit: Payer: BC Managed Care – PPO | Admitting: Obstetrics & Gynecology

## 2023-01-29 ENCOUNTER — Other Ambulatory Visit: Payer: Self-pay | Admitting: Cardiovascular Disease

## 2023-02-19 ENCOUNTER — Ambulatory Visit: Payer: BC Managed Care – PPO | Attending: Cardiovascular Disease | Admitting: Cardiovascular Disease

## 2023-02-19 ENCOUNTER — Encounter: Payer: Self-pay | Admitting: Cardiovascular Disease

## 2023-02-19 ENCOUNTER — Encounter: Payer: Self-pay | Admitting: *Deleted

## 2023-02-19 VITALS — BP 130/77 | HR 57 | Ht 64.0 in | Wt 175.0 lb

## 2023-02-19 DIAGNOSIS — I10 Essential (primary) hypertension: Secondary | ICD-10-CM

## 2023-02-19 DIAGNOSIS — R079 Chest pain, unspecified: Secondary | ICD-10-CM

## 2023-02-19 DIAGNOSIS — E782 Mixed hyperlipidemia: Secondary | ICD-10-CM | POA: Diagnosis not present

## 2023-02-19 NOTE — Progress Notes (Signed)
Cardiology Office Note:    Date:  02/19/2023   ID:  Katie [facility], DOB Jun 22, 1956, MRN [id number]  PCP:  Katie Paradise, MD   [facility] Providers Cardiologist:  Katie Bollman, MD     Referring MD: Katie Paradise, MD   Chief Complaint  Patient presents with   Chest Pain    History of Present Illness:    Katie [facility] is a 67 y.o. female presenting for follow-up evaluation.  She is here alone today.  She has a history of chest pain undergoing evaluation in 2018 when echocardiogram and nuclear stress testing studies were obtained, both with normal findings.  The patient is also followed for hypertension and has been treated with atenolol.  Mixed hyperlipidemia has been treated with atorvastatin.  At the time of her visit 1 year ago she was felt to have suboptimal control of her hypertension.  Atenolol was reduced from 50 down to 25 mg daily and losartan 50 mg was added.  She reports episodic chest discomfort that is centrally located in the chest, feels like a squeezing, pressure-like sensation.  She has wondered if this is reflux or a cardiac issue.  It can come on at rest but also has been associated with exertion at times.  She experiences recently when riding a horse.  She denies shortness of breath, orthopnea, PND, or leg swelling.  She does have heart palpitations.  She has been under stress between her work and her father's progressive health decline.  Past Medical History:  Diagnosis Date   Arthritis    Endometrial polyp    Fibromyalgia    per pt has not been dx by doctor   GERD (gastroesophageal reflux disease)    Hyperlipidemia    Leiomyoma    Multiple small on ultrasound   Migraine    PMB (postmenopausal bleeding)    Wears glasses     Past Surgical History:  Procedure Laterality Date   CARDIOVASCULAR STRESS TEST  05/17/2017   dr [facility]   Low risk nuclear study w/ chest pain and positive ECG changes noted at peak exertion; mild apical ishemia ;  reversible small defect of mild severity present in the apical anterior and apex location;  nuclear stress ef 75% , normal wall motion   DILATATION & CURETTAGE/HYSTEROSCOPY WITH MYOSURE N/A 05/29/2017   Procedure: DILATATION & CURETTAGE/HYSTEROSCOPY WITH MYOSURE;  Surgeon: Katie Lords, MD;  Location: [facility];  Service: Gynecology;  Laterality: N/A;   ORIF RIGHT ANKLE FRACTURE  1992   retained hardware   TONSILLECTOMY AND ADENOIDECTOMY  age 48   TRANSTHORACIC ECHOCARDIOGRAM  05/17/2017   dr [facility]   ef 55-60%    Current Medications: Current Meds  Medication Sig   ALPRAZolam (XANAX) 0.5 MG tablet Take 1 tablet (0.5 mg total) by mouth at bedtime as needed for anxiety.   atenolol (TENORMIN) 25 MG tablet Take 1 tablet (25 mg total) by mouth daily.   atorvastatin (LIPITOR) 20 MG tablet Take 20 mg by mouth every evening.    Cholecalciferol (VITAMIN D3) 2000 units TABS Take 1 tablet by mouth daily.   Coenzyme Q10 (COQ-10) 100 MG CAPS Take 1 capsule by mouth daily.   FLUoxetine (PROZAC) 10 MG capsule Take 10 mg by mouth every evening.    HYDROcodone-acetaminophen (NORCO/VICODIN) 5-325 MG tablet Take 1-2 tablets by mouth daily as needed for migraine.   loratadine (CLARITIN) 10 MG tablet Take 10 mg by mouth daily as needed for allergies.   losartan (  COZAAR) 50 MG tablet Take 1 tablet (50 mg total) by mouth daily. Pt.needs appt.with Cardiologist for future refills. Thank You. 1st Attempt.   multivitamin-lutein (OCUVITE-LUTEIN) CAPS capsule Take 1 capsule by mouth daily.   omeprazole (PRILOSEC) 40 MG capsule Take 40 mg by mouth 2 (two) times daily.   ondansetron (ZOFRAN-ODT) 8 MG disintegrating tablet Take 1 tablet (8 mg total) by mouth every 8 (eight) hours as needed for nausea.   zolmitriptan (ZOMIG) 5 MG nasal solution Place 1 spray into the nose daily as needed for migraine.      Allergies:   Patient has no known allergies.   Social History   Socioeconomic History    Marital status: Married    Spouse name: Not on file   Number of children: Not on file   Years of education: Not on file   Highest education level: Not on file  Occupational History   Not on file  Tobacco Use   Smoking status: Never   Smokeless tobacco: Never  Vaping Use   Vaping Use: Never used  Substance and Sexual Activity   Alcohol use: Not Currently    Comment: occasional   Drug use: No   Sexual activity: Yes    Birth control/protection: Post-menopausal    Comment: 1st intercourse 67 yo-Fewer than 5 partners  Other Topics Concern   Not on file  Social History Narrative   Not on file   Social Determinants of Health   Financial Resource Strain: Not on file  Food Insecurity: Not on file  Transportation Needs: Not on file  Physical Activity: Not on file  Stress: Not on file  Social Connections: Not on file     Family History: The patient's family history includes Aortic stenosis in her father; Heart disease in her father; Hypertension in her father; Obesity in her sister; Stroke in her mother. There is no history of Breast cancer.  ROS:   Please see the history of present illness.    All other systems reviewed and are negative.  EKGs/Labs/Other Studies Reviewed:    The following studies were reviewed today: Cardiac Studies & Procedures     STRESS TESTS  MYOCARDIAL PERFUSION IMAGING 05/20/2017  Narrative  Nuclear stress EF: 75%.  Blood pressure demonstrated a normal response to exercise.  ST segment depression of 2 mm was noted during stress in the V5 and V6 leads, and returning to baseline after less than 1 minute of recovery.  Defect 1: There is a small defect of mild severity present in the apical anterior and apex location.  Low risk stress nuclear study with chest pain and positive ECG changes noted at peak exertion; mild apical ischemia; EF 75 with normal wall motion.   ECHOCARDIOGRAM  ECHOCARDIOGRAM COMPLETE 05/17/2017  Narrative *[facility] [address] [place], [place] [address] [phone]  ------------------------------------------------------------------- Transthoracic Echocardiography  Patient:    Katie [facility] MR #:       [id number] Study Date: 05/17/2017 Gender:     F Age:        85 Height:     162.6 cm Weight:     85.2 kg BSA:        1.99 m^2 Pt. Status: Room:  SONOGRAPHER  Cathie Beams ATTENDING    Katie Bollman, MD ORDERING     Katie Bollman, MD REFERRING    Katie Bollman, MD PERFORMING   [facility], Outpatient  cc:  ------------------------------------------------------------------- LV EF: 55% -   60%  ------------------------------------------------------------------- Indications:  R07.9 Exertional chest pain. R06.02 Shortness of breath.  ------------------------------------------------------------------- History:   PMH:  Fibromyalgia.  Risk factors:  Dyslipidemia.  ------------------------------------------------------------------- Study Conclusions  - Left ventricle: The cavity size was normal. Wall thickness was normal. Systolic function was normal. The estimated ejection fraction was in the range of 55% to 60%. Wall motion was normal; there were no regional wall motion abnormalities.  Impressions:  - Definity used; normal LV systolic function; probable mild diastolic dysfunction.  ------------------------------------------------------------------- Study data:   Study status:  Routine.  Procedure:  The patient reported no pain pre or post test. Transthoracic echocardiography. Image quality was adequate. Intravenous contrast (Definity) was administered.  Study completion:  There were no complications. Transthoracic echocardiography.  M-mode, complete 2D, spectral Doppler, and color Doppler.  Birthdate:  Patient birthdate: [birth year].  Age:  Patient is 67 yr old.  Sex:  Gender: female. BMI: 32.2 kg/m^2.  Blood pressure:     161/94  Patient status: Outpatient.  Study  date:  Study date: 05/17/2017. Study time: 09:49 AM.  Location:  [facility]  -------------------------------------------------------------------  ------------------------------------------------------------------- Left ventricle:  The cavity size was normal. Wall thickness was normal. Systolic function was normal. The estimated ejection fraction was in the range of 55% to 60%. Wall motion was normal; there were no regional wall motion abnormalities. Findings consistent with left ventricular diastolic dysfunction.  ------------------------------------------------------------------- Aortic valve:   Trileaflet; normal thickness leaflets. Mobility was not restricted.  Doppler:  Transvalvular velocity was within the normal range. There was no stenosis. There was no regurgitation.  ------------------------------------------------------------------- Aorta:  Aortic root: The aortic root was normal in size.  ------------------------------------------------------------------- Mitral valve:   Structurally normal valve.   Mobility was not restricted.  Doppler:  Transvalvular velocity was within the normal range. There was no evidence for stenosis. There was no regurgitation.    Peak gradient (D): 2 mm Hg.  ------------------------------------------------------------------- Left atrium:  The atrium was normal in size.  ------------------------------------------------------------------- Right ventricle:  The cavity size was normal. Systolic function was normal.  ------------------------------------------------------------------- Pulmonic valve:    Doppler:  Transvalvular velocity was within the normal range. There was no evidence for stenosis.  ------------------------------------------------------------------- Tricuspid valve:   Structurally normal valve.    Doppler: Transvalvular velocity was within the normal range. There was trivial  regurgitation.  ------------------------------------------------------------------- Right atrium:  The atrium was normal in size.  ------------------------------------------------------------------- Pericardium:  There was no pericardial effusion.  ------------------------------------------------------------------- Measurements  Left ventricle                           Value        Reference LV ID, ED, PLAX chordal                  43.2  mm     43 - 52 LV ID, ES, PLAX chordal                  23.2  mm     23 - 38 LV fx shortening, PLAX chordal           46    %      >=29 LV PW thickness, ED                      7.86  mm     ---------- IVS/LV PW ratio, ED            (H)  1.45         <=1.3 LV e&', lateral                           9.25  cm/s   ---------- LV E/e&', lateral                         8.28         ---------- LV e&', medial                            4.46  cm/s   ---------- LV E/e&', medial                          17.17        ---------- LV e&', average                           6.86  cm/s   ---------- LV E/e&', average                         11.17        ----------  Ventricular septum                       Value        Reference IVS thickness, ED                        11.36 mm     ----------  LVOT                                     Value        Reference LVOT ID, S                               20    mm     ---------- LVOT area                                3.14  cm^2   ----------  Aorta                                    Value        Reference Aortic root ID, ED                       28    mm     ----------  Left atrium                              Value        Reference LA ID, A-P, ES                           31    mm     ---------- LA ID/bsa, A-P  1.56  cm/m^2 <=2.2 LA volume, S                             50.9  ml     ---------- LA volume/bsa, S                         25.5  ml/m^2 ---------- LA volume, ES, 1-p A4C                    50    ml     ---------- LA volume/bsa, ES, 1-p A4C               25.1  ml/m^2 ---------- LA volume, ES, 1-p A2C                   46.4  ml     ---------- LA volume/bsa, ES, 1-p A2C               23.3  ml/m^2 ----------  Mitral valve                             Value        Reference Mitral E-wave peak velocity              76.6  cm/s   ---------- Mitral A-wave peak velocity              60.1  cm/s   ---------- Mitral deceleration time       (H)       246   ms     150 - 230 Mitral peak gradient, D                  2     mm Hg  ---------- Mitral E/A ratio, peak                   1.3          ----------  Right atrium                             Value        Reference RA ID, S-I, ES, A4C                      42    mm     34 - 49 RA area, ES, A4C                         12.8  cm^2   8.3 - 19.5 RA volume, ES, A/L                       29.8  ml     ---------- RA volume/bsa, ES, A/L                   14.9  ml/m^2 ----------  Right ventricle                          Value        Reference RV s&', lateral, S                        9.03  cm/s   ----------  Legend: (L)  and  (H)  mark values outside specified reference range.  ------------------------------------------------------------------- Prepared and Electronically Authenticated by  Olga Millers 2018-08-17T11:56:22              EKG:  EKG is ordered today.  The ekg ordered today demonstrates sinus bradycardia 57 bpm, otherwise normal tracing.  Recent Labs: No results found for requested labs within last 365 days.  Recent Lipid Panel    Component Value Date/Time   CHOL 282 (H) 03/27/2016 0947   TRIG 466 (H) 03/27/2016 0947   HDL 37 (L) 03/27/2016 0947   CHOLHDL 7.6 (H) 03/27/2016 0947   VLDL NOT CALC 03/27/2016 0947   LDLCALC NOT CALC 03/27/2016 0947     Risk Assessment/Calculations:                Physical Exam:    VS:  BP 130/77 (BP Location: Left Arm, Patient Position: Sitting, Cuff Size: Normal)   Pulse  (!) 57   Ht 5\' 4"  (1.626 m)   Wt 175 lb (79.4 kg)   LMP 03/16/2017   BMI 30.04 kg/m     Wt Readings from Last 3 Encounters:  02/19/23 175 lb (79.4 kg)  02/01/22 181 lb 12.8 oz (82.5 kg)  01/19/22 182 lb (82.6 kg)     GEN:  Well nourished, well developed in no acute distress HEENT: Normal NECK: No JVD; No carotid bruits LYMPHATICS: No lymphadenopathy CARDIAC: RRR, no murmurs, rubs, gallops RESPIRATORY:  Clear to auscultation without rales, wheezing or rhonchi  ABDOMEN: Soft, non-tender, non-distended MUSCULOSKELETAL:  No edema; No deformity  SKIN: Warm and dry NEUROLOGIC:  Alert and oriented x 3 PSYCHIATRIC:  Normal affect   ASSESSMENT:    1. Essential hypertension   2. Mixed hyperlipidemia   3. Exertional chest pain    PLAN:    In order of problems listed above:  Blood pressure well-controlled on atenolol and losartan.  Upcoming labs planned with her primary physician.  Continue current management. Treated with atorvastatin.  Labs pending with her PCP. Patient has chest pain with typical and atypical features.  I have recommended further evaluation with a Myoview stress test to evaluate for obstructive coronary artery disease.  Otherwise continue current management.      Shared Decision Making/Informed Consent The risks [chest pain, shortness of breath, cardiac arrhythmias, dizziness, blood pressure fluctuations, myocardial infarction, stroke/transient ischemic attack, nausea, vomiting, allergic reaction, radiation exposure, metallic taste sensation and life-threatening complications (estimated to be 1 in 10,000)], benefits (risk stratification, diagnosing coronary artery disease, treatment guidance) and alternatives of a nuclear stress test were discussed in detail with [facility] and she agrees to proceed.    Medication Adjustments/Labs and Tests Ordered: Current medicines are reviewed at length with the patient today.  Concerns regarding medicines are outlined  above.  Orders Placed This Encounter  Procedures   MYOCARDIAL PERFUSION IMAGING   EKG 12-Lead   No orders of the defined types were placed in this encounter.   Patient Instructions  Medication Instructions:  Your physician recommends that you continue on your current medications as directed. Please refer to the Current Medication list given to you today.  *If you need a refill on your cardiac medications before your next appointment, please call your pharmacy*   Lab Work: none If you have labs (blood work) drawn today and your tests are completely normal, you will receive your results only by: MyChart Message (if you have MyChart) OR A paper copy  in the mail If you have any lab test that is abnormal or we need to change your treatment, we will call you to review the results.   Testing/Procedures: Your physician has requested that you have an exercise stress myoview. For further information please visit [email or web address] Please follow instruction sheet, as given.    Follow-Up: At [facility], you and your health needs are our priority.  As part of our continuing mission to provide you with exceptional heart care, we have created designated Provider Care Teams.  These Care Teams include your primary Cardiologist (physician) and Advanced Practice Providers (APPs -  Physician Assistants and Nurse Practitioners) who all work together to provide you with the care you need, when you need it.  We recommend signing up for the patient portal called "MyChart".  Sign up information is provided on this After Visit Summary.  MyChart is used to connect with patients for Virtual Visits (Telemedicine).  Patients are able to view lab/test results, encounter notes, upcoming appointments, etc.  Non-urgent messages can be sent to your provider as well.   To learn more about what you can do with MyChart, go to [email or web address].    Your next appointment:   12 month(s)  Provider:    Tonny Bollman, MD     Other Instructions      Signed, Katie Bollman, MD  02/19/2023 5:37 PM    [facility]

## 2023-02-19 NOTE — Patient Instructions (Signed)
Medication Instructions:  Your physician recommends that you continue on your current medications as directed. Please refer to the Current Medication list given to you today.  *If you need a refill on your cardiac medications before your next appointment, please call your pharmacy*   Lab Work: none If you have labs (blood work) drawn today and your tests are completely normal, you will receive your results only by: MyChart Message (if you have MyChart) OR A paper copy in the mail If you have any lab test that is abnormal or we need to change your treatment, we will call you to review the results.   Testing/Procedures: Your physician has requested that you have an exercise stress myoview. For further information please visit [email or web address] Please follow instruction sheet, as given.    Follow-Up: At [facility], you and your health needs are our priority.  As part of our continuing mission to provide you with exceptional heart care, we have created designated Provider Care Teams.  These Care Teams include your primary Cardiologist (physician) and Advanced Practice Providers (APPs -  Physician Assistants and Nurse Practitioners) who all work together to provide you with the care you need, when you need it.  We recommend signing up for the patient portal called "MyChart".  Sign up information is provided on this After Visit Summary.  MyChart is used to connect with patients for Virtual Visits (Telemedicine).  Patients are able to view lab/test results, encounter notes, upcoming appointments, etc.  Non-urgent messages can be sent to your provider as well.   To learn more about what you can do with MyChart, go to [email or web address].    Your next appointment:   12 month(s)  Provider:   Tonny Bollman, MD     Other Instructions

## 2023-02-20 DIAGNOSIS — E785 Hyperlipidemia, unspecified: Secondary | ICD-10-CM | POA: Diagnosis not present

## 2023-02-20 DIAGNOSIS — H53483 Generalized contraction of visual field, bilateral: Secondary | ICD-10-CM | POA: Diagnosis not present

## 2023-02-20 DIAGNOSIS — I10 Essential (primary) hypertension: Secondary | ICD-10-CM | POA: Diagnosis not present

## 2023-02-20 DIAGNOSIS — Z0000 Encounter for general adult medical examination without abnormal findings: Secondary | ICD-10-CM | POA: Diagnosis not present

## 2023-02-22 ENCOUNTER — Other Ambulatory Visit: Payer: Self-pay | Admitting: Cardiovascular Disease

## 2023-02-22 DIAGNOSIS — Z0000 Encounter for general adult medical examination without abnormal findings: Secondary | ICD-10-CM | POA: Diagnosis not present

## 2023-02-25 ENCOUNTER — Other Ambulatory Visit: Payer: Self-pay | Admitting: Cardiovascular Disease

## 2023-02-26 ENCOUNTER — Telehealth (HOSPITAL_COMMUNITY): Payer: Self-pay | Admitting: *Deleted

## 2023-02-26 NOTE — Telephone Encounter (Signed)
Spoke with pt and gave detailed instructions for MPI study on 02/28/23.

## 2023-02-27 DIAGNOSIS — Z1339 Encounter for screening examination for other mental health and behavioral disorders: Secondary | ICD-10-CM | POA: Diagnosis not present

## 2023-02-27 DIAGNOSIS — E785 Hyperlipidemia, unspecified: Secondary | ICD-10-CM | POA: Diagnosis not present

## 2023-02-27 DIAGNOSIS — Z0000 Encounter for general adult medical examination without abnormal findings: Secondary | ICD-10-CM | POA: Diagnosis not present

## 2023-02-27 DIAGNOSIS — E669 Obesity, unspecified: Secondary | ICD-10-CM | POA: Diagnosis not present

## 2023-02-27 DIAGNOSIS — R82998 Other abnormal findings in urine: Secondary | ICD-10-CM | POA: Diagnosis not present

## 2023-02-27 DIAGNOSIS — I10 Essential (primary) hypertension: Secondary | ICD-10-CM | POA: Diagnosis not present

## 2023-02-28 ENCOUNTER — Ambulatory Visit (HOSPITAL_COMMUNITY): Payer: BC Managed Care – PPO | Attending: Cardiology

## 2023-02-28 DIAGNOSIS — I10 Essential (primary) hypertension: Secondary | ICD-10-CM | POA: Insufficient documentation

## 2023-02-28 DIAGNOSIS — R079 Chest pain, unspecified: Secondary | ICD-10-CM | POA: Diagnosis not present

## 2023-02-28 LAB — MYOCARDIAL PERFUSION IMAGING
Base ST Depression (mm): 0 mm
Estimated workload: 6.4
Exercise duration (min): 4 min
Exercise duration (sec): 30 s
LV dias vol: 49 mL (ref 46–106)
LV sys vol: 16 mL
MPHR: 155 {beats}/min
Nuc Stress EF: 68 %
Peak HR: 162 {beats}/min
Percent HR: 104 %
Rest HR: 65 {beats}/min
Rest Nuclear Isotope Dose: 10.9 mCi
SDS: 1
SRS: 0
SSS: 1
ST Depression (mm): 2 mm
Stress Nuclear Isotope Dose: 31.7 mCi
TID: 0.81

## 2023-02-28 MED ORDER — TECHNETIUM TC 99M TETROFOSMIN IV KIT
31.7000 | PACK | Freq: Once | INTRAVENOUS | Status: AC | PRN
  Administered 2023-02-28: 31.7 via INTRAVENOUS

## 2023-02-28 MED ORDER — TECHNETIUM TC 99M TETROFOSMIN IV KIT
10.9000 | PACK | Freq: Once | INTRAVENOUS | Status: AC | PRN
  Administered 2023-02-28: 10.9 via INTRAVENOUS

## 2023-02-28 MED ORDER — REGADENOSON 0.4 MG/5ML IV SOLN: 0.4000 mg | Freq: Once | INTRAVENOUS | Status: DC

## 2023-04-05 ENCOUNTER — Ambulatory Visit: Payer: BC Managed Care – PPO | Admitting: Physician Assistant

## 2023-05-14 DIAGNOSIS — D485 Neoplasm of uncertain behavior of skin: Secondary | ICD-10-CM | POA: Diagnosis not present

## 2023-05-14 DIAGNOSIS — L821 Other seborrheic keratosis: Secondary | ICD-10-CM | POA: Diagnosis not present

## 2023-05-14 DIAGNOSIS — H02423 Myogenic ptosis of bilateral eyelids: Secondary | ICD-10-CM | POA: Diagnosis not present

## 2023-06-06 DIAGNOSIS — H35033 Hypertensive retinopathy, bilateral: Secondary | ICD-10-CM | POA: Diagnosis not present

## 2023-06-06 DIAGNOSIS — H2513 Age-related nuclear cataract, bilateral: Secondary | ICD-10-CM | POA: Diagnosis not present

## 2023-06-06 DIAGNOSIS — H53143 Visual discomfort, bilateral: Secondary | ICD-10-CM | POA: Diagnosis not present

## 2023-08-12 DIAGNOSIS — I10 Essential (primary) hypertension: Secondary | ICD-10-CM | POA: Diagnosis not present

## 2023-08-28 DIAGNOSIS — H02831 Dermatochalasis of right upper eyelid: Secondary | ICD-10-CM | POA: Diagnosis not present

## 2023-08-28 DIAGNOSIS — L905 Scar conditions and fibrosis of skin: Secondary | ICD-10-CM | POA: Diagnosis not present

## 2023-08-28 DIAGNOSIS — H0279 Other degenerative disorders of eyelid and periocular area: Secondary | ICD-10-CM | POA: Diagnosis not present

## 2023-12-31 ENCOUNTER — Ambulatory Visit (INDEPENDENT_AMBULATORY_CARE_PROVIDER_SITE_OTHER): Payer: Self-pay | Admitting: Obstetrics and Gynecology

## 2023-12-31 ENCOUNTER — Encounter: Payer: Self-pay | Admitting: Obstetrics and Gynecology

## 2023-12-31 ENCOUNTER — Other Ambulatory Visit (HOSPITAL_COMMUNITY)
Admission: RE | Admit: 2023-12-31 | Discharge: 2023-12-31 | Disposition: A | Discharge status: Home / Self Care | Source: Ambulatory Visit | Attending: Obstetrics and Gynecology | Admitting: Obstetrics and Gynecology

## 2023-12-31 VITALS — BP 112/60 | HR 67 | Temp 98.0°F | Ht 64.25 in | Wt 179.0 lb

## 2023-12-31 DIAGNOSIS — Z124 Encounter for screening for malignant neoplasm of cervix: Secondary | ICD-10-CM | POA: Insufficient documentation

## 2023-12-31 DIAGNOSIS — E2839 Other primary ovarian failure: Secondary | ICD-10-CM

## 2023-12-31 DIAGNOSIS — Z1331 Encounter for screening for depression: Secondary | ICD-10-CM | POA: Diagnosis not present

## 2023-12-31 DIAGNOSIS — Z01419 Encounter for gynecological examination (general) (routine) without abnormal findings: Secondary | ICD-10-CM | POA: Diagnosis not present

## 2023-12-31 DIAGNOSIS — Z13820 Encounter for screening for osteoporosis: Secondary | ICD-10-CM

## 2023-12-31 NOTE — Patient Instructions (Signed)

## 2023-12-31 NOTE — Progress Notes (Signed)
 68 y.o. [id number] postmenopausal female here for annual exam. Married. Doing well, no complaints.  Postmenopausal bleeding: none Pelvic discharge or pain: none Breast mass, nipple discharge or skin changes : none Last PAP:     Component Value Date/Time   DIAGPAP  01/19/2022 1139    - Negative for intraepithelial lesion or malignancy (NILM)   ADEQPAP  01/19/2022 1139    Satisfactory for evaluation; transformation zone component PRESENT.   Last mammogram: 02/02/2022 Last colonoscopy: June 2021 Last DXA: 2019, normal Sexually active: Yes Exercising: No Smoker: No  GYN HISTORY: No significant history  OB History  Gravida Para Term Preterm AB Living  5 3   2 3   SAB IAB Ectopic Multiple Live Births  1 1       # Outcome Date GA Lbr Len/2nd Weight Sex Type Anes PTL Lv  5 SAB           4 IAB           3 Para           2 Para           1 Para             Past Medical History:  Diagnosis Date   Arthritis    Endometrial polyp    Fibromyalgia    per pt has not been dx by doctor   GERD (gastroesophageal reflux disease)    Hyperlipidemia    Leiomyoma    Multiple small on ultrasound   Migraine    PMB (postmenopausal bleeding)    Wears glasses     Past Surgical History:  Procedure Laterality Date   CARDIOVASCULAR STRESS TEST  05/17/2017   dr cooper   Low risk nuclear study w/ chest pain and positive ECG changes noted at peak exertion; mild apical ishemia ; reversible small defect of mild severity present in the apical anterior and apex location;  nuclear stress ef 75% , normal wall motion   DILATATION & CURETTAGE/HYSTEROSCOPY WITH MYOSURE N/A 05/29/2017   Procedure: DILATATION & CURETTAGE/HYSTEROSCOPY WITH MYOSURE;  Surgeon: Dara Lords, MD;  Location: [facility];  Service: Gynecology;  Laterality: N/A;   ORIF RIGHT ANKLE FRACTURE  1992   retained hardware   TONSILLECTOMY AND ADENOIDECTOMY  age 61   TRANSTHORACIC ECHOCARDIOGRAM  05/17/2017   dr  cooper   ef 55-60%    Current Outpatient Medications on File Prior to Visit  Medication Sig Dispense Refill   atenolol (TENORMIN) 25 MG tablet Take 1 tablet (25 mg total) by mouth daily. 90 tablet 3   atorvastatin (LIPITOR) 20 MG tablet Take 20 mg by mouth every evening.   5   Cholecalciferol (VITAMIN D3) 2000 units TABS Take 1 tablet by mouth daily.     Coenzyme Q10 (COQ-10) 100 MG CAPS Take 1 capsule by mouth daily.     FLUoxetine (PROZAC) 10 MG capsule Take 10 mg by mouth every evening.      HYDROcodone-acetaminophen (NORCO/VICODIN) 5-325 MG tablet Take 1-2 tablets by mouth daily as needed for migraine.  0   loratadine (CLARITIN) 10 MG tablet Take 10 mg by mouth daily as needed for allergies.     losartan (COZAAR) 50 MG tablet Take 1 tablet (50 mg total) by mouth daily. . 90 tablet 3   multivitamin-lutein (OCUVITE-LUTEIN) CAPS capsule Take 1 capsule by mouth daily.     omeprazole (PRILOSEC) 40 MG capsule Take 40 mg by mouth 2 (two) times daily.  No current facility-administered medications on file prior to visit.    Social History   Socioeconomic History   Marital status: Married    Spouse name: Not on file   Number of children: Not on file   Years of education: Not on file   Highest education level: Not on file  Occupational History   Not on file  Tobacco Use   Smoking status: Never   Smokeless tobacco: Never  Vaping Use   Vaping status: Never Used  Substance and Sexual Activity   Alcohol use: Yes    Comment: occasional   Drug use: No   Sexual activity: Yes    Birth control/protection: Post-menopausal    Comment: 1st intercourse 68 yo-Fewer than 5 partners  Other Topics Concern   Not on file  Social History Narrative   Not on file   Social Drivers of Health   Financial Resource Strain: Not on file  Food Insecurity: Not on file  Transportation Needs: Not on file  Physical Activity: Not on file  Stress: Not on file  Social Connections: Not on file  Intimate  Partner Violence: Not on file    Family History  Problem Relation Age of Onset   Stroke Mother    Hypertension Father    Heart disease Father    Aortic stenosis Father    Obesity Sister    Breast cancer Neg Hx     No Known Allergies    PE Today's Vitals   12/31/23 1336  BP: 112/60  Pulse: 67  Temp: 98 F (36.7 C)  TempSrc: Oral  SpO2: 97%  Weight: 179 lb (81.2 kg)  Height: 5' 4.25" (1.632 m)   Body mass index is 30.49 kg/m.  Physical Exam Vitals reviewed. Exam conducted with a chaperone present.  Constitutional:      General: She is not in acute distress.    Appearance: Normal appearance.  HENT:     Head: Normocephalic and atraumatic.     Nose: Nose normal.  Eyes:     Extraocular Movements: Extraocular movements intact.     Conjunctiva/sclera: Conjunctivae normal.  Neck:     Thyroid: No thyroid mass, thyromegaly or thyroid tenderness.  Pulmonary:     Effort: Pulmonary effort is normal.  Chest:     Chest wall: No mass or tenderness.  Breasts:    Right: Normal. No swelling, mass, nipple discharge, skin change or tenderness.     Left: Normal. No swelling, mass, nipple discharge, skin change or tenderness.  Abdominal:     General: There is no distension.     Palpations: Abdomen is soft.     Tenderness: There is no abdominal tenderness.  Genitourinary:    General: Normal vulva.     Exam position: Lithotomy position.     Urethra: No prolapse.     Vagina: Normal. No vaginal discharge or bleeding.     Cervix: Normal. No lesion.     Uterus: Normal. Not enlarged and not tender.      Adnexa: Right adnexa normal and left adnexa normal.  Musculoskeletal:        General: Normal range of motion.     Cervical back: Normal range of motion.  Lymphadenopathy:     Upper Body:     Right upper body: No axillary adenopathy.     Left upper body: No axillary adenopathy.     Lower Body: No right inguinal adenopathy. No left inguinal adenopathy.  Skin:    General: Skin is  warm  and dry.  Neurological:     General: No focal deficit present.     Mental Status: She is alert.  Psychiatric:        Mood and Affect: Mood normal.        Behavior: Behavior normal.       Assessment and Plan:        Well woman exam with routine gynecological exam Assessment & Plan: Cervical cancer screening performed according to ASCCP guidelines, Wants to continue Pap smear screening at this time Encouraged annual mammogram screening Colonoscopy UTD DXA due Labs and immunizations with her primary Encouraged safe sexual practices as indicated Encouraged healthy lifestyle practices with diet and exercise For patients under 50-70yo, I recommend 1200mg  calcium daily and 600IU of vitamin D daily.    Cervical cancer screening -     Cytology - PAP  Screening for osteoporosis -     DG Bone Density; Future  Primary ovarian failure -     DG Bone Density; Future    Rosalyn Gess, MD

## 2023-12-31 NOTE — Assessment & Plan Note (Addendum)
 Cervical cancer screening performed according to ASCCP guidelines, Wants to continue Pap smear screening at this time Encouraged annual mammogram screening Colonoscopy UTD DXA due Labs and immunizations with her primary Encouraged safe sexual practices as indicated Encouraged healthy lifestyle practices with diet and exercise For patients under 50-66yo, I recommend 1200mg  calcium daily and 600IU of vitamin D daily.

## 2024-01-02 ENCOUNTER — Encounter: Payer: Self-pay | Admitting: Obstetrics and Gynecology

## 2024-01-02 LAB — CYTOLOGY - PAP: Diagnosis: NEGATIVE

## 2024-02-18 ENCOUNTER — Other Ambulatory Visit: Payer: Self-pay | Admitting: Cardiovascular Disease

## 2024-03-03 DIAGNOSIS — I10 Essential (primary) hypertension: Secondary | ICD-10-CM | POA: Diagnosis not present

## 2024-03-03 DIAGNOSIS — E785 Hyperlipidemia, unspecified: Secondary | ICD-10-CM | POA: Diagnosis not present

## 2024-03-10 ENCOUNTER — Other Ambulatory Visit: Payer: Self-pay | Admitting: Cardiovascular Disease

## 2024-03-16 DIAGNOSIS — E785 Hyperlipidemia, unspecified: Secondary | ICD-10-CM | POA: Diagnosis not present

## 2024-03-16 DIAGNOSIS — G43009 Migraine without aura, not intractable, without status migrainosus: Secondary | ICD-10-CM | POA: Diagnosis not present

## 2024-03-16 DIAGNOSIS — R82998 Other abnormal findings in urine: Secondary | ICD-10-CM | POA: Diagnosis not present

## 2024-03-16 DIAGNOSIS — Z0000 Encounter for general adult medical examination without abnormal findings: Secondary | ICD-10-CM | POA: Diagnosis not present

## 2024-03-16 DIAGNOSIS — K219 Gastro-esophageal reflux disease without esophagitis: Secondary | ICD-10-CM | POA: Diagnosis not present

## 2024-03-16 DIAGNOSIS — E669 Obesity, unspecified: Secondary | ICD-10-CM | POA: Diagnosis not present

## 2024-03-16 DIAGNOSIS — I10 Essential (primary) hypertension: Secondary | ICD-10-CM | POA: Diagnosis not present

## 2024-03-18 ENCOUNTER — Other Ambulatory Visit: Payer: Self-pay | Admitting: Cardiovascular Disease

## 2024-03-25 ENCOUNTER — Other Ambulatory Visit: Payer: Self-pay | Admitting: Cardiovascular Disease

## 2024-03-27 ENCOUNTER — Telehealth: Payer: Self-pay | Admitting: Cardiovascular Disease

## 2024-03-27 MED ORDER — LOSARTAN POTASSIUM 50 MG PO TABS: 50.0000 mg | ORAL_TABLET | Freq: Every day | ORAL | 0 refills | Status: DC

## 2024-03-27 NOTE — Telephone Encounter (Signed)
*  STAT* If patient is at the pharmacy, call can be transferred to refill team.   1. Which medications need to be refilled? (please list name of each medication and dose if known) losartan  (COZAAR ) 50 MG tablet    2. Would you like to learn more about the convenience, safety, & potential cost savings by using the [facility]?    3. Are you open to using the [facility] (Type [facility].  ).   4. Which pharmacy/location (including street and city if local pharmacy) is medication to be sent to? [facility] [place], [place] - [address]    5. Do they need a 30 day or 90 day supply? 90 day

## 2024-03-27 NOTE — Telephone Encounter (Signed)
 Pt's medication was sent to pt's pharmacy as requested. Confirmation received.

## 2024-04-15 ENCOUNTER — Other Ambulatory Visit: Payer: Self-pay | Admitting: Cardiovascular Disease

## 2024-05-01 DIAGNOSIS — M5412 Radiculopathy, cervical region: Secondary | ICD-10-CM | POA: Diagnosis not present

## 2024-05-01 DIAGNOSIS — M542 Cervicalgia: Secondary | ICD-10-CM | POA: Diagnosis not present

## 2024-05-01 DIAGNOSIS — R5383 Other fatigue: Secondary | ICD-10-CM | POA: Diagnosis not present

## 2024-05-01 DIAGNOSIS — R21 Rash and other nonspecific skin eruption: Secondary | ICD-10-CM | POA: Diagnosis not present

## 2024-05-01 DIAGNOSIS — M791 Myalgia, unspecified site: Secondary | ICD-10-CM | POA: Diagnosis not present

## 2024-05-04 DIAGNOSIS — B029 Zoster without complications: Secondary | ICD-10-CM | POA: Diagnosis not present

## 2024-05-27 ENCOUNTER — Encounter: Payer: Self-pay | Admitting: Cardiovascular Disease

## 2024-05-27 ENCOUNTER — Ambulatory Visit: Attending: Cardiovascular Disease | Admitting: Cardiovascular Disease

## 2024-05-27 VITALS — BP 118/78 | HR 60 | Ht 64.0 in | Wt 181.2 lb

## 2024-05-27 DIAGNOSIS — I10 Essential (primary) hypertension: Secondary | ICD-10-CM

## 2024-05-27 DIAGNOSIS — E782 Mixed hyperlipidemia: Secondary | ICD-10-CM | POA: Diagnosis not present

## 2024-05-27 MED ORDER — ROSUVASTATIN CALCIUM 10 MG PO TABS: 10.0000 mg | ORAL_TABLET | Freq: Every day | ORAL | 3 refills | Status: AC

## 2024-05-27 NOTE — Progress Notes (Signed)
 Cardiology Office Note:    Date:  05/27/2024   ID:  Katie Shepard, DOB 18-Sep-1956, MRN [id number]  PCP:  Shepard Ade, MD   [facility] Providers Cardiologist:  Ozell Fell, MD     Referring MD: Shepard Ade, MD   Chief Complaint  Patient presents with   Hypertension    History of Present Illness:    Katie Shepard is a 68 y.o. female presenting for follow-up of chest pain.  When I saw her in 2024 she complained of chest pain with typical and atypical features, but also with an exertional component.  A Myoview  stress test was ordered and demonstrates no perfusion abnormalities with normal LVEF of 68%.  Comorbid conditions include hypertension and mixed hyperlipidemia.  The patient is Katie Shepard's daughter who was a longtime patient of mine.  She is doing well from a cardiac perspective.  She denies chest pain, shortness of breath, edema, orthopnea, PND.  She has some fatigue.  She has had concerns in the past about use of a statin drug but does not recall her specific side effects.  She is currently taking rosuvastatin  20 mg once weekly.   Current Medications: Current Meds  Medication Sig   ALPRAZolam  (XANAX ) 1 MG tablet Take 1 mg by mouth as needed. Taking 1/2 tablet as needed for anxiety   atenolol  (TENORMIN ) 25 MG tablet Take 1 tablet (25 mg total) by mouth daily.   Cholecalciferol (VITAMIN D3) 2000 units TABS Take 1 tablet by mouth daily.   Coenzyme Q10 (COQ-10) 100 MG CAPS Take 1 capsule by mouth daily.   FLUoxetine (PROZAC) 10 MG capsule Take 10 mg by mouth every evening.    HYDROcodone-acetaminophen  (NORCO/VICODIN) 5-325 MG tablet Take 1-2 tablets by mouth daily as needed for migraine.   loratadine (CLARITIN) 10 MG tablet Take 10 mg by mouth daily as needed for allergies.   losartan  (COZAAR ) 50 MG tablet Take 1 tablet (50 mg total) by mouth daily.   multivitamin-lutein (OCUVITE-LUTEIN) CAPS capsule Take 1 capsule by mouth daily.   omeprazole  (PRILOSEC) 40 MG capsule Take 40 mg by mouth 2 (two) times daily.   rosuvastatin  (CRESTOR ) 10 MG tablet Take 1 tablet (10 mg total) by mouth daily.   traZODone (DESYREL) 50 MG tablet Take 50 mg by mouth at bedtime.   [DISCONTINUED] rosuvastatin  (CRESTOR ) 20 MG tablet Take 20 mg by mouth daily.     Allergies:   Patient has no known allergies.   ROS:   Please see the history of present illness.    All other systems reviewed and are negative.  EKGs/Labs/Other Studies Reviewed:    The following studies were reviewed today: Cardiac Studies & Procedures   ______________________________________________________________________________________________   STRESS TESTS  MYOCARDIAL PERFUSION IMAGING 02/28/2023  Interpretation Summary   The study is normal with no perfusion abnormalities. The study is low risk.   2.0 mm of horizontal ST depression in the inferolateral leads (II, III, aVF, V5 and V6) was noted.   LV perfusion is normal. There is no evidence of ischemia. There is no evidence of infarction.   Left ventricular function is normal. Nuclear stress EF: 68 %. The left ventricular ejection fraction is hyperdynamic (>65%). End diastolic cavity size is normal.   No change from prior stress test in 2018.  ST segment depressions were noted then as well.   ECHOCARDIOGRAM  ECHOCARDIOGRAM COMPLETE 05/17/2017  Narrative *[facility]* [address] [place], [place] [address] [phone]  ------------------------------------------------------------------- Transthoracic  Echocardiography  Patient:    Katie Shepard, Stay MR #:       [id number] Study Date: 05/17/2017 Gender:     F Age:        76 Height:     162.6 cm Weight:     85.2 kg BSA:        1.99 m^2 Pt. Status: Room:  SONOGRAPHER  Jon Hacker ATTENDING    Ozell Fell, MD ORDERING     Ozell Fell, MD REFERRING    Ozell Fell, MD PERFORMING   [facility],  Outpatient  cc:  ------------------------------------------------------------------- LV EF: 55% -   60%  ------------------------------------------------------------------- Indications:      R07.9 Exertional chest pain. R06.02 Shortness of breath.  ------------------------------------------------------------------- History:   PMH:  Fibromyalgia.  Risk factors:  Dyslipidemia.  ------------------------------------------------------------------- Study Conclusions  - Left ventricle: The cavity size was normal. Wall thickness was normal. Systolic function was normal. The estimated ejection fraction was in the range of 55% to 60%. Wall motion was normal; there were no regional wall motion abnormalities.  Impressions:  - Definity  used; normal LV systolic function; probable mild diastolic dysfunction.  ------------------------------------------------------------------- Study data:   Study status:  Routine.  Procedure:  The patient reported no pain pre or post test. Transthoracic echocardiography. Image quality was adequate. Intravenous contrast (Definity ) was administered.  Study completion:  There were no complications. Transthoracic echocardiography.  M-mode, complete 2D, spectral Doppler, and color Doppler.  Birthdate:  Patient birthdate: [birth year].  Age:  Patient is 68 yr old.  Sex:  Gender: female. BMI: 32.2 kg/m^2.  Blood pressure:     161/94  Patient status: Outpatient.  Study date:  Study date: 05/17/2017. Study time: 09:49 AM.  Location:  [facility]  -------------------------------------------------------------------  ------------------------------------------------------------------- Left ventricle:  The cavity size was normal. Wall thickness was normal. Systolic function was normal. The estimated ejection fraction was in the range of 55% to 60%. Wall motion was normal; there were no regional wall motion abnormalities. Findings consistent with left ventricular  diastolic dysfunction.  ------------------------------------------------------------------- Aortic valve:   Trileaflet; normal thickness leaflets. Mobility was not restricted.  Doppler:  Transvalvular velocity was within the normal range. There was no stenosis. There was no regurgitation.  ------------------------------------------------------------------- Aorta:  Aortic root: The aortic root was normal in size.  ------------------------------------------------------------------- Mitral valve:   Structurally normal valve.   Mobility was not restricted.  Doppler:  Transvalvular velocity was within the normal range. There was no evidence for stenosis. There was no regurgitation.    Peak gradient (D): 2 mm Hg.  ------------------------------------------------------------------- Left atrium:  The atrium was normal in size.  ------------------------------------------------------------------- Right ventricle:  The cavity size was normal. Systolic function was normal.  ------------------------------------------------------------------- Pulmonic valve:    Doppler:  Transvalvular velocity was within the normal range. There was no evidence for stenosis.  ------------------------------------------------------------------- Tricuspid valve:   Structurally normal valve.    Doppler: Transvalvular velocity was within the normal range. There was trivial regurgitation.  ------------------------------------------------------------------- Right atrium:  The atrium was normal in size.  ------------------------------------------------------------------- Pericardium:  There was no pericardial effusion.  ------------------------------------------------------------------- Measurements  Left ventricle                           Value        Reference LV ID, ED, PLAX chordal                  43.2  mm     43 -  52 LV ID, ES, PLAX chordal                  23.2  mm     23 - 38 LV fx shortening, PLAX  chordal           46    %      >=29 LV PW thickness, ED                      7.86  mm     ---------- IVS/LV PW ratio, ED            (H)       1.45         <=1.3 LV e&', lateral                           9.25  cm/s   ---------- LV E/e&', lateral                         8.28         ---------- LV e&', medial                            4.46  cm/s   ---------- LV E/e&', medial                          17.17        ---------- LV e&', average                           6.86  cm/s   ---------- LV E/e&', average                         11.17        ----------  Ventricular septum                       Value        Reference IVS thickness, ED                        11.36 mm     ----------  LVOT                                     Value        Reference LVOT ID, S                               20    mm     ---------- LVOT area                                3.14  cm^2   ----------  Aorta                                    Value        Reference Aortic root ID, ED  28    mm     ----------  Left atrium                              Value        Reference LA ID, A-P, ES                           31    mm     ---------- LA ID/bsa, A-P                           1.56  cm/m^2 <=2.2 LA volume, S                             50.9  ml     ---------- LA volume/bsa, S                         25.5  ml/m^2 ---------- LA volume, ES, 1-p A4C                   50    ml     ---------- LA volume/bsa, ES, 1-p A4C               25.1  ml/m^2 ---------- LA volume, ES, 1-p A2C                   46.4  ml     ---------- LA volume/bsa, ES, 1-p A2C               23.3  ml/m^2 ----------  Mitral valve                             Value        Reference Mitral E-wave peak velocity              76.6  cm/s   ---------- Mitral A-wave peak velocity              60.1  cm/s   ---------- Mitral deceleration time       (H)       246   ms     150 - 230 Mitral peak gradient, D                  2     mm Hg   ---------- Mitral E/A ratio, peak                   1.3          ----------  Right atrium                             Value        Reference RA ID, S-I, ES, A4C                      42    mm     34 - 49 RA area, ES, A4C                         12.8  cm^2   8.3 - 19.5 RA volume, ES, A/L  29.8  ml     ---------- RA volume/bsa, ES, A/L                   14.9  ml/m^2 ----------  Right ventricle                          Value        Reference RV s&', lateral, S                        9.03  cm/s   ----------  Legend: (L)  and  (H)  mark values outside specified reference range.  ------------------------------------------------------------------- Prepared and Electronically Authenticated by  Redell Shallow 2018-08-17T11:56:22          ______________________________________________________________________________________________      EKG:   EKG Interpretation Date/Time:  Wednesday May 27 2024 14:27:45 EDT Ventricular Rate:  60 PR Interval:  168 QRS Duration:  84 QT Interval:  426 QTC Calculation: 426 R Axis:   -18  Text Interpretation: Normal sinus rhythm Normal ECG No previous ECGs available Confirmed by Wonda Sharper [id number]) on 05/27/2024 2:37:07 PM    Recent Labs: No results found for requested labs within last 365 days.  Recent Lipid Panel    Component Value Date/Time   CHOL 282 (H) 03/27/2016 0947   TRIG 466 (H) 03/27/2016 0947   HDL 37 (L) 03/27/2016 0947   CHOLHDL 7.6 (H) 03/27/2016 0947   VLDL NOT CALC 03/27/2016 0947   LDLCALC NOT CALC 03/27/2016 0947     Risk Assessment/Calculations:                Physical Exam:    VS:  BP 118/78   Pulse 60   Ht 5' 4 (1.626 m)   Wt 181 lb 3.2 oz (82.2 kg)   LMP 03/16/2017   SpO2 98%   BMI 31.10 kg/m     Wt Readings from Last 3 Encounters:  05/27/24 181 lb 3.2 oz (82.2 kg)  12/31/23 179 lb (81.2 kg)  02/28/23 175 lb (79.4 kg)     GEN:  Well nourished, well developed in no acute  distress HEENT: Normal NECK: No JVD; No carotid bruits LYMPHATICS: No lymphadenopathy CARDIAC: RRR, no murmurs, rubs, gallops RESPIRATORY:  Clear to auscultation without rales, wheezing or rhonchi  ABDOMEN: Soft, non-tender, non-distended MUSCULOSKELETAL:  No edema; No deformity  SKIN: Warm and dry NEUROLOGIC:  Alert and oriented x 3 PSYCHIATRIC:  Normal affect   Assessment & Plan Essential hypertension  Mixed hyperlipidemia Has had concerns about statins and statin side effects in the past. Was off of medication and her most recent chol/LDL are 276/175. She has started taking crestor  once weekly, and is open to taking it more regularly. Will try crestor  10 mg daily and check lipids/LFT's in 3 months. If unable to tolerate, decrease to M/W/F dosing.             Medication Adjustments/Labs and Tests Ordered: Current medicines are reviewed at length with the patient today.  Concerns regarding medicines are outlined above.  Orders Placed This Encounter  Procedures   Lipid panel   Hepatic function panel   EKG 12-Lead   Meds ordered this encounter  Medications   rosuvastatin  (CRESTOR ) 10 MG tablet    Sig: Take 1 tablet (10 mg total) by mouth daily.    Dispense:  90 tablet    Refill:  3    Patient Instructions  Medication Instructions:  START Rosuvastatin  (Crestor ) 10 mg once daily in the morning  *If you need a refill on your cardiac medications before your next appointment, please call your pharmacy*  Lab Work: To be completed in 3 months: FASTING lipid panel and LFTs  If you have labs (blood work) drawn today and your tests are completely normal, you will receive your results only by: MyChart Message (if you have MyChart) OR A paper copy in the mail If you have any lab test that is abnormal or we need to change your treatment, we will call you to review the results.  Testing/Procedures: None ordered today.  Follow-Up: At [facility], you and your  health needs are our priority.  As part of our continuing mission to provide you with exceptional heart care, our providers are all part of one team.  This team includes your primary Cardiologist (physician) and Advanced Practice Providers or APPs (Physician Assistants and Nurse Practitioners) who all work together to provide you with the care you need, when you need it.  Your next appointment:   1 year(s)  Provider:   Ozell Fell, MD      Signed, Ozell Fell, MD  05/27/2024 2:52 PM    [facility]

## 2024-05-27 NOTE — Patient Instructions (Signed)
 Medication Instructions:  START Rosuvastatin  (Crestor ) 10 mg once daily in the morning  *If you need a refill on your cardiac medications before your next appointment, please call your pharmacy*  Lab Work: To be completed in 3 months: FASTING lipid panel and LFTs  If you have labs (blood work) drawn today and your tests are completely normal, you will receive your results only by: MyChart Message (if you have MyChart) OR A paper copy in the mail If you have any lab test that is abnormal or we need to change your treatment, we will call you to review the results.  Testing/Procedures: None ordered today.  Follow-Up: At [facility], you and your health needs are our priority.  As part of our continuing mission to provide you with exceptional heart care, our providers are all part of one team.  This team includes your primary Cardiologist (physician) and Advanced Practice Providers or APPs (Physician Assistants and Nurse Practitioners) who all work together to provide you with the care you need, when you need it.  Your next appointment:   1 year(s)  Provider:   Ozell Fell, MD

## 2024-06-17 DIAGNOSIS — L0390 Cellulitis, unspecified: Secondary | ICD-10-CM | POA: Diagnosis not present

## 2024-06-17 DIAGNOSIS — W57XXXA Bitten or stung by nonvenomous insect and other nonvenomous arthropods, initial encounter: Secondary | ICD-10-CM | POA: Diagnosis not present

## 2024-06-25 ENCOUNTER — Other Ambulatory Visit: Payer: Self-pay | Admitting: Cardiovascular Disease

## 2024-06-30 ENCOUNTER — Ambulatory Visit: Admitting: Cardiovascular Disease

## 2024-07-06 ENCOUNTER — Other Ambulatory Visit: Payer: Self-pay | Admitting: Cardiovascular Disease

## 2024-08-17 DIAGNOSIS — R5381 Other malaise: Secondary | ICD-10-CM | POA: Diagnosis not present

## 2024-08-17 DIAGNOSIS — R0981 Nasal congestion: Secondary | ICD-10-CM | POA: Diagnosis not present

## 2024-08-17 DIAGNOSIS — J060 Acute laryngopharyngitis: Secondary | ICD-10-CM | POA: Diagnosis not present

## 2024-08-17 DIAGNOSIS — R051 Acute cough: Secondary | ICD-10-CM | POA: Diagnosis not present

## 2024-08-17 DIAGNOSIS — R0982 Postnasal drip: Secondary | ICD-10-CM | POA: Diagnosis not present
